# Patient Record
Sex: Male | Born: 1985 | Race: Black or African American | Hispanic: No | Marital: Single | Smoking: Current every day smoker
Health system: Southern US, Community
[De-identification: ages and names within clinical notes are randomized; demographics above are authoritative.]

## PROBLEM LIST (undated history)

## (undated) DIAGNOSIS — J45909 Unspecified asthma, uncomplicated: Secondary | ICD-10-CM

---
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
## 2007-09-26 ENCOUNTER — Emergency Department (HOSPITAL_COMMUNITY): Admission: EM | Admit: 2007-09-26 | Discharge: 2007-09-26 | Payer: Self-pay | Admitting: Emergency Medicine

## 2007-10-04 ENCOUNTER — Ambulatory Visit: Payer: Self-pay | Admitting: Psychiatry

## 2007-10-04 ENCOUNTER — Inpatient Hospital Stay (HOSPITAL_COMMUNITY): Admission: AD | Admit: 2007-10-04 | Discharge: 2007-10-08 | Payer: Self-pay | Admitting: Psychiatry

## 2007-10-08 ENCOUNTER — Emergency Department (HOSPITAL_COMMUNITY): Admission: EM | Admit: 2007-10-08 | Discharge: 2007-10-08 | Payer: Self-pay | Admitting: Emergency Medicine

## 2009-08-16 ENCOUNTER — Emergency Department (HOSPITAL_COMMUNITY): Admission: EM | Admit: 2009-08-16 | Discharge: 2009-08-17 | Payer: Self-pay | Admitting: Emergency Medicine

## 2010-01-10 ENCOUNTER — Inpatient Hospital Stay (HOSPITAL_COMMUNITY): Admission: EM | Admit: 2010-01-10 | Discharge: 2010-01-11 | Payer: Self-pay | Admitting: Emergency Medicine

## 2010-03-27 ENCOUNTER — Emergency Department (HOSPITAL_COMMUNITY): Admission: EM | Admit: 2010-03-27 | Discharge: 2010-03-27 | Payer: Self-pay | Admitting: Family Medicine
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
## 2011-01-27 LAB — POCT I-STAT, CHEM 8
Calcium, Ion: 1.05 mmol/L — ABNORMAL LOW (ref 1.12–1.32)
Chloride: 106 mEq/L (ref 96–112)
Creatinine, Ser: 1.2 mg/dL (ref 0.4–1.5)
Glucose, Bld: 102 mg/dL — ABNORMAL HIGH (ref 70–99)
HCT: 45 % (ref 39.0–52.0)

## 2011-01-27 LAB — SAMPLE TO BLOOD BANK
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
## 2011-03-21 NOTE — Op Note (Signed)
[name], TANT NO.:  [id number]   MEDICAL RECORD NO.:  [id number]                   PATIENT TYPE:  EMS   LOCATION:  ED                                   FACILITY:  [facility]   PHYSICIAN:  Lindaann Slough, M.D.               DATE OF BIRTH:  1986-04-26   DATE OF PROCEDURE:  05/10/2004  DATE OF DISCHARGE:                                 OPERATIVE REPORT   PREOPERATIVE DIAGNOSES:  Torsion of right testis.   POSTOPERATIVE DIAGNOSES:  Torsion of right testis.   PROCEDURE:  Scrotal exploration right testis, bilateral orchiopexy and  bilateral excision of appendix testis.   SURGEON:  Lindaann Slough, M.D.   ANESTHESIA:  General.   INDICATIONS FOR PROCEDURE:  The patient is a 25 year old male who woke up  this morning at about 7:30 with sudden onset of severe right testicular  pain.  He was taken to the emergency room.  Physical examination showed a  swollen and exquisitely tender right testis.  Ultrasound of the scrotum  showed no blood flow to the right testis and a small right hydrocele and  normal flow to the left testis. A diagnosis of torsion was made and the  patient is scheduled for exploration of the right testis, possible right  orchiectomy and orchiopexy.   Under general anesthesia, the patient was prepped and draped and placed in  the supine position.  A longitudinal incision was made on the right scrotum.  The incision was carried down to the tunica vaginalis which was then  incised. A small amount of pinkish fluid was drained out from the tunica  vaginalis.  The testis was found to be dark and there was 180 degree torsion  of the spermatic cord.  The torsion of the spermatic cord was done and the  testis was wrapped in warm saline.  Then an incision was made on the left  scrotum. The incision was carried down to the tunica vaginalis which was  then incised.  Incision of appendix testis was done and then an orchiopexy  was done by  approximating the medial lateral and inferior aspect of the  testis to the medial, lateral and inferior portion of the scrotum.  The  testis was then replaced in the scrotum and the scrotum was closed in two  layers with 2-0 Vicryl.  Then attention was replaced on the right testis.  The color of the right testis has changed to pink. Then a small incision was  made in the tunica albuginea and there was pink blood coming out of the  incision. The incision was then closed with 3-0 Vicryl and because we had  good flow and the color of the testis has changed too, I think it was  decided to save the testicle. Excision of the appendix testis was done and  then an orchiopexy was done using the same technique by approximating  the  medial, lateral and inferior aspect of the testis to the medial lateral and  inferior aspect of the scrotum with 2-0 silk. Hemostasis was secured with  electrocautery.  Then the incision was closed in two layers with 3-0 Vicryl.   The patient tolerated the procedure well and left the OR in satisfactory  condition to post anesthesia care unit.                                               Lindaann Slough, M.D.    MN/MEDQ  D:  05/10/2004  T:  05/10/2004  Job:  [id number]
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
## 2011-03-21 NOTE — Discharge Summary (Signed)
[name], [name] NO.:  [id number]   MEDICAL RECORD NO.:  [id number]          PATIENT TYPE:  IPS   LOCATION:  0400                          FACILITY:  BH   PHYSICIAN:  Anselm Jungling, MD  DATE OF BIRTH:  Jul 16, 1986   DATE OF ADMISSION:  10/04/2007  DATE OF DISCHARGE:  10/08/2007                               DISCHARGE SUMMARY   IDENTIFYING DATA AND REASON FOR ADMISSION:  This was an inpatient  psychiatric admission for [name], a 25 year old single African American  male admitted due to increasing suicidal ideation and possible  psychosis.  He came to [place] with a prior history of schizoaffective  disorder.  He had been treated here as an inpatient in 2006 when he was  25 years old, on our adolescent unit.  He was also involved IN cannabis  Abuse.  He was given initial Axis I diagnosis of schizoaffective  disorder by history, currently depressed, rule out psychotic features,  rule out substance-induced psychosis, and cannabis abuse.  Please refer  to the admission note for further details pertaining to the symptoms,  circumstances and history that led to his hospitalization.   MEDICAL AND LABORATORY:  The patient was in good health without any  active or chronic medical problems, but he did have a history of sickle  cell trait.  He was medically and physically assessed by the psychiatric  nurse practitioner.  He was treated with Augmentin for a lesion on his  left forearm that appeared to be some form of insect bite.  This  resolved without complications.   HOSPITAL COURSE:  The patient was admitted to the adult inpatient  psychiatric service.  He presented as a well-nourished, well-developed  male who initially only gave brief acknowledgment of our efforts to  interview him.  It was presumed that he was depressed, guarded, and  possibly psychotic.   The following day, the patient acknowledged that he had ignored me the  day prior, and explained that he was  just depressed, hearing voices, I  needed my medication.   The patient was up and attending groups, was alert, and fully oriented.  He was pleasant and cooperative.  He was treated with Seroquel 200 mg  q.h.s..  He was agreeable to a family meeting.  However, there was not  adequate time for the family meeting to occur.  The patient continued  stable, well compensated, pleasant and cooperative, and absent suicidal  ideation.  He appeared appropriate for discharge on the fifth hospital  day.  He agreed to the following aftercare plan.   AFTERCARE:  The patient was to follow-up the [facility] with an  appointment on December 18, 23 8, and also the [facility], with an  appointment on October 08, 2007, to help address substance abuse issues.   DISCHARGE MEDICATIONS:  Augmentin 875 mg b.i.d. until finished, and  Seroquel 200 mg q.h.s..   DISCHARGE DIAGNOSES:  AXIS I: Status post substance-induced psychosis,  polysubstance abuse NOS.  AXIS II: Deferred.  AXIS III: No acute or chronic illnesses, status post insect bite,  resolving.  AXIS IV: Stressors severe.  AXIS V: GAF on discharge 55.      Anselm Jungling, MD  Electronically Signed     SPB/MEDQ  D:  10/21/2007  T:  10/22/2007  Job:  [id number]
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
## 2011-07-06 ENCOUNTER — Emergency Department (HOSPITAL_COMMUNITY): Payer: Medicaid Other

## 2011-07-06 ENCOUNTER — Emergency Department (HOSPITAL_COMMUNITY)
Admission: EM | Admit: 2011-07-06 | Discharge: 2011-07-07 | Disposition: A | Discharge status: Home / Self Care | Payer: Medicaid Other | Attending: Emergency Medicine | Admitting: Emergency Medicine

## 2011-07-06 DIAGNOSIS — X500XXA Overexertion from strenuous movement or load, initial encounter: Secondary | ICD-10-CM | POA: Insufficient documentation

## 2011-07-06 DIAGNOSIS — S99919A Unspecified injury of unspecified ankle, initial encounter: Secondary | ICD-10-CM | POA: Insufficient documentation

## 2011-07-06 DIAGNOSIS — Z8659 Personal history of other mental and behavioral disorders: Secondary | ICD-10-CM | POA: Insufficient documentation

## 2011-07-06 DIAGNOSIS — F329 Major depressive disorder, single episode, unspecified: Secondary | ICD-10-CM | POA: Insufficient documentation

## 2011-07-06 DIAGNOSIS — F411 Generalized anxiety disorder: Secondary | ICD-10-CM | POA: Insufficient documentation

## 2011-07-06 DIAGNOSIS — Y92009 Unspecified place in unspecified non-institutional (private) residence as the place of occurrence of the external cause: Secondary | ICD-10-CM | POA: Insufficient documentation

## 2011-07-06 DIAGNOSIS — S8990XA Unspecified injury of unspecified lower leg, initial encounter: Secondary | ICD-10-CM | POA: Insufficient documentation

## 2011-07-06 DIAGNOSIS — F3289 Other specified depressive episodes: Secondary | ICD-10-CM | POA: Insufficient documentation

## 2011-07-06 DIAGNOSIS — M25569 Pain in unspecified knee: Secondary | ICD-10-CM | POA: Insufficient documentation

## 2011-07-06 DIAGNOSIS — S83106A Unspecified dislocation of unspecified knee, initial encounter: Secondary | ICD-10-CM | POA: Insufficient documentation

## 2011-08-11 LAB — TSH: TSH: 0.685

## 2011-08-11 LAB — URINALYSIS, ROUTINE W REFLEX MICROSCOPIC
Nitrite: NEGATIVE
Specific Gravity, Urine: 1.009
Urobilinogen, UA: 0.2
pH: 7.5

## 2011-08-11 LAB — COMPREHENSIVE METABOLIC PANEL
Albumin: 3.9
BUN: 8
Calcium: 8.9
Creatinine, Ser: 1.06
Potassium: 3.3 — ABNORMAL LOW
Total Protein: 6.4

## 2011-08-11 LAB — CBC
HCT: 40.8
MCHC: 34.4
MCV: 91.5
Platelets: 221
RDW: 13.5

## 2011-08-11 LAB — THC (MARIJUANA), URINE, CONFIRMATION: Marijuana, Ur-Confirmation: 180 ng/mL

## 2011-08-11 LAB — GC/CHLAMYDIA PROBE AMP, URINE: Chlamydia, Swab/Urine, PCR: NEGATIVE

## 2011-08-11 LAB — DRUGS OF ABUSE SCREEN W/O ALC, ROUTINE URINE
Amphetamine Screen, Ur: NEGATIVE
Creatinine,U: 72.7
Marijuana Metabolite: POSITIVE — AB
Opiate Screen, Urine: NEGATIVE
Propoxyphene: NEGATIVE

## 2013-05-11 ENCOUNTER — Emergency Department (HOSPITAL_COMMUNITY)
Admission: EM | Admit: 2013-05-11 | Discharge: 2013-05-11 | Disposition: A | Discharge status: Home / Self Care | Payer: Medicaid Other | Attending: Emergency Medicine | Admitting: Emergency Medicine

## 2013-05-11 ENCOUNTER — Encounter (HOSPITAL_COMMUNITY): Payer: Self-pay | Admitting: Emergency Medicine

## 2013-05-11 DIAGNOSIS — K089 Disorder of teeth and supporting structures, unspecified: Secondary | ICD-10-CM | POA: Insufficient documentation

## 2013-05-11 DIAGNOSIS — K029 Dental caries, unspecified: Secondary | ICD-10-CM | POA: Insufficient documentation

## 2013-05-11 DIAGNOSIS — J45909 Unspecified asthma, uncomplicated: Secondary | ICD-10-CM | POA: Insufficient documentation

## 2013-05-11 DIAGNOSIS — K0889 Other specified disorders of teeth and supporting structures: Secondary | ICD-10-CM

## 2013-05-11 DIAGNOSIS — H5710 Ocular pain, unspecified eye: Secondary | ICD-10-CM | POA: Insufficient documentation

## 2013-05-11 DIAGNOSIS — H9209 Otalgia, unspecified ear: Secondary | ICD-10-CM | POA: Insufficient documentation

## 2013-05-11 HISTORY — DX: Unspecified asthma, uncomplicated: J45.909

## 2013-05-11 MED ORDER — BUPIVACAINE HCL 0.25 % IJ SOLN: 5.0000 mL | Freq: Once | INTRAMUSCULAR | Status: DC

## 2013-05-11 MED ORDER — OXYCODONE-ACETAMINOPHEN 10-325 MG PO TABS: 1.0000 | ORAL_TABLET | ORAL | Status: DC | PRN

## 2013-05-11 MED ORDER — BUPIVACAINE HCL (PF) 0.5 % IJ SOLN: 5.0000 mL | Freq: Once | INTRAMUSCULAR | Status: DC

## 2013-05-11 MED ORDER — BUPIVACAINE HCL (PF) 0.25 % IJ SOLN: 5.0000 mL | Freq: Once | INTRAMUSCULAR | Status: DC

## 2013-05-11 NOTE — ED Notes (Signed)
PT. REPORTS RIGHT LOWER MOLAR PAIN FOR 2 WEEKS .

## 2013-05-11 NOTE — ED Provider Notes (Signed)
History    CSN: [id number] Arrival date & time 05/11/13  2144  None    Chief Complaint  Patient presents with  . Dental Pain   (Consider location/radiation/quality/duration/timing/severity/associated sxs/prior Treatment) HPI Comments: Patient admits to dental follow up tomorrow at 3pm. States dentist gave him tramadol for pain control without relief. Has also taken course of Amoxicillin prescribed by his dentist to cover for infection without relief.  Patient is a 27 y.o. male presenting with tooth pain. The history is provided by the patient. No language interpreter was used.  Dental Pain Location:  Lower Lower teeth location: R lower canine to 2nd molar. Quality:  Sharp, throbbing and aching Severity:  Moderate Onset quality:  Gradual Duration:  2 weeks Timing:  Constant Progression:  Worsening Chronicity:  New Context: recent dental surgery (symptoms began after multiple dental fillings)   Previous work-up:  Filled cavity Relieved by:  Nothing Ineffective treatments:  Acetaminophen and NSAIDs Associated symptoms: no difficulty swallowing, no drooling, no fever, no gum swelling, no neck swelling, no oral bleeding, no oral lesions and no trismus   Associated symptoms comment:  + R ear and R eye pain (intermittent)  Past Medical History  Diagnosis Date  . Asthma    History reviewed. No pertinent past surgical history. No family history on file. History  Substance Use Topics  . Smoking status: Not on file  . Smokeless tobacco: Not on file  . Alcohol Use: Not on file    Review of Systems  Constitutional: Negative for fever.  HENT: Negative for drooling and mouth sores.     Allergies  Review of patient's allergies indicates no known allergies.  Home Medications   Current Outpatient Rx  Name  Route  Sig  Dispense  Refill  . amoxicillin (AMOXIL) 500 MG capsule   Oral   Take 500 mg by mouth 2 (two) times daily.         Marland Kitchen oxyCODONE-acetaminophen (PERCOCET)  10-325 MG per tablet   Oral   Take 1 tablet by mouth every 4 (four) hours as needed for pain.          BP 133/77  Pulse 57  Temp(Src) 98.5 F (36.9 C) (Oral)  Resp 14  SpO2 98% Physical Exam  Nursing note and vitals reviewed. Constitutional: He is oriented to person, place, and time. He appears well-developed and well-nourished. No distress.  HENT:  Head: Normocephalic and atraumatic. No trismus in the jaw.  Right Ear: Tympanic membrane, external ear and ear canal normal. No mastoid tenderness.  Left Ear: Tympanic membrane, external ear and ear canal normal. No mastoid tenderness.  Nose: Nose normal.  Mouth/Throat: Uvula is midline and mucous membranes are normal. No oral lesions. Normal dentition. Dental caries (on left) present. No dental abscesses, edematous or lacerations. No oropharyngeal exudate, posterior oropharyngeal edema, posterior oropharyngeal erythema or tonsillar abscesses.  Eyes: Conjunctivae and EOM are normal. Pupils are equal, round, and reactive to light. No scleral icterus.  Neck: Normal range of motion. Neck supple.  Cardiovascular: Normal rate, regular rhythm and intact distal pulses.   Pulmonary/Chest: Effort normal. No stridor. No respiratory distress.  Musculoskeletal: Normal range of motion.  Lymphadenopathy:    He has no cervical adenopathy.  Neurological: He is alert and oriented to person, place, and time.  Skin: Skin is warm and dry. No rash noted. He is not diaphoretic. No erythema. No pallor.  Psychiatric: He has a normal mood and affect.   ED Course  Dental Date/Time: 05/12/2013  7:49 AM Performed by: Antony Madura Authorized by: Antony Madura Consent: Verbal consent obtained. written consent not obtained. Risks and benefits: risks, benefits and alternatives were discussed Consent given by: patient Patient understanding: patient states understanding of the procedure being performed Patient consent: the patient's understanding of the procedure  matches consent given Procedure consent: procedure consent matches procedure scheduled Relevant documents: relevant documents present and verified Test results: test results available and properly labeled Site marked: the operative site was marked Imaging studies: imaging studies available Required items: required blood products, implants, devices, and special equipment available Patient identity confirmed: verbally with patient Time out: Immediately prior to procedure a "time out" was called to verify the correct patient, procedure, equipment, support staff and site/side marked as required. Preparation: Patient was prepped and draped in the usual sterile fashion. Local anesthesia used: yes Anesthesia: nerve block Local anesthetic: bupivacaine 0.5% without epinephrine Anesthetic total: 3 ml Patient sedated: no Patient tolerance: Patient tolerated the procedure well with no immediate complications. Comments: Inferior alveolar nerve block on R side with 3ml of bupivacaine   (including critical care time) Labs Reviewed - No data to display No results found.  1. Pain, dental    MDM  Uncomplicated dental pain - Patient afebrile and hemodynamically stable. Uvula midline without evidence of peritonsillar abscess. No trismus or stridor. No concerning signs for Ludwig's Angina. Inferior alveolar nerve block attempted with mild-moderate relief of symptoms. Patient appropriate for d/c with dental follow up at appointment tomorrow. Rx for Percocet given to tide patient over to this appointment. Indications for ED return discussed with the patient who verbalizes comfort and understanding with this d/c plan.  Antony Madura, PA-C 05/12/13 [phone]

## 2013-05-12 NOTE — ED Provider Notes (Signed)
Medical screening examination/treatment/procedure(s) were performed by non-physician practitioner and as supervising physician I was immediately available for consultation/collaboration.  Olivia Mackie, MD 05/12/13 2156

## 2014-01-24 ENCOUNTER — Encounter (HOSPITAL_COMMUNITY): Payer: Self-pay | Admitting: Emergency Medicine

## 2014-01-24 ENCOUNTER — Emergency Department (INDEPENDENT_AMBULATORY_CARE_PROVIDER_SITE_OTHER)
Admission: EM | Admit: 2014-01-24 | Discharge: 2014-01-24 | Disposition: A | Discharge status: Home / Self Care | Payer: Medicaid Other | Source: Home / Self Care | Attending: Emergency Medicine | Admitting: Emergency Medicine

## 2014-01-24 DIAGNOSIS — M779 Enthesopathy, unspecified: Secondary | ICD-10-CM

## 2014-01-24 MED ORDER — MELOXICAM 15 MG PO TABS: 15.0000 mg | ORAL_TABLET | Freq: Every day | ORAL | Status: DC

## 2014-01-24 MED ORDER — TRAMADOL HCL 50 MG PO TABS: 100.0000 mg | ORAL_TABLET | Freq: Three times a day (TID) | ORAL | Status: DC | PRN

## 2014-01-24 NOTE — ED Notes (Signed)
States hurt right arm Saturday while lifting at work; states started hurting later that day.

## 2014-01-24 NOTE — Discharge Instructions (Signed)
Tendinitis °Tendinitis is swelling and inflammation of the tendons. Tendons are band-like tissues that connect muscle to bone. Tendinitis commonly occurs in the:  °· Shoulders (rotator cuff). °· Heels (Achilles tendon). °· Elbows (triceps tendon). °CAUSES °Tendinitis is usually caused by overusing the tendon, muscles, and joints involved. When the tissue surrounding a tendon (synovium) becomes inflamed, it is called tenosynovitis. Tendinitis commonly develops in people whose jobs require repetitive motions. °SYMPTOMS °· Pain. °· Tenderness. °· Mild swelling. °DIAGNOSIS °Tendinitis is usually diagnosed by physical exam. Your caregiver may also order X-rays or other imaging tests. °TREATMENT °Your caregiver may recommend certain medicines or exercises for your treatment. °HOME CARE INSTRUCTIONS  °· Use a sling or splint for as long as directed by your caregiver until the pain decreases. °· Put ice on the injured area. °· Put ice in a plastic bag. °· Place a towel between your skin and the bag. °· Leave the ice on for 15-20 minutes, 03-04 times a day. °· Avoid using the limb while the tendon is painful. Perform gentle range of motion exercises only as directed by your caregiver. Stop exercises if pain or discomfort increase, unless directed otherwise by your caregiver. °· Only take over-the-counter or prescription medicines for pain, discomfort, or fever as directed by your caregiver. °SEEK MEDICAL CARE IF:  °· Your pain and swelling increase. °· You develop new, unexplained symptoms, especially increased numbness in the hands. °MAKE SURE YOU:  °· Understand these instructions. °· Will watch your condition. °· Will get help right away if you are not doing well or get worse. °Document Released: 10/17/2000 Document Revised: 01/12/2012 Document Reviewed: 01/06/2011 °ExitCare® Patient Information ©2014 [employer]. ° °

## 2014-01-24 NOTE — ED Provider Notes (Signed)
Chief Complaint    Chief Complaint  Patient presents with  . Arm Pain    History of Present Illness     [name] is a 28 year old male who has had a four-day history of right wrist and forearm pain after doing a lot of heavy lifting at work. The patient states that objects he lives range from 5 pounds to 100 pounds. He denies any specific injury, but after work his arm felt sore. This extends from the wrist up to the elbow. It hurts to flex his wrist. There is some swelling. No numbness or tingling of the hand. The pain is confined to the dorsum of the forearm. There is no muscle weakness.  Review of Systems     Other than as noted above, the patient denies any of the following symptoms: Systemic:  No fevers, chills, sweats, or muscle aches.  No weight loss.  Musculoskeletal:  No joint pain, arthritis, bursitis, swelling, back pain, or neck pain. Neurological:  No muscular weakness, paresthesias, headache, or trouble with speech or coordination.  No dizziness.  PMFSH    Past medical history, family history, social history, meds, and allergies were reviewed.  He has asthma and uses albuterol inhaler.  Physical Exam    Vital signs:  BP 125/83  Pulse 70  Temp(Src) 98.3 F (36.8 C) (Oral)  Resp 16  SpO2 100% Gen:  Alert and oriented times 3.  In no distress. Musculoskeletal: There is pain to palpation over the dorsum of the forearm extending from the wrist up to just proximal to the elbow but not in the elbow itself. The elbow joint was nontender, nonswollen, and have full range of motion with no pain. He has pain with flexion and extension of the wrist, particularly with forceful dorsiflexion. There is no swelling of the forearm, no pain over the carpal tunnel.  Otherwise, all joints had a full a ROM with no swelling, bruising or deformity.  No edema, pulses full. Extremities were warm and pink.  Capillary refill was brisk.  Skin:  Clear, warm and dry.  No rash. Neuro:  Alert and  oriented times 3.  Muscle strength was normal.  Sensation was intact to light touch.   [facility] in [facility]   He was given a thumb spica splint, and is to wear this continuously for the next 2 weeks.  Assessment    The encounter diagnosis was Tendonitis.  Due to overuse at work. Patient states this is not a workers comp case.  Plan   1.  Meds:  The following meds were prescribed:   Discharge Medication List as of 01/24/2014 10:40 AM    START taking these medications   Details  meloxicam (MOBIC) 15 MG tablet Take 1 tablet (15 mg total) by mouth daily., Starting 01/24/2014, Until Discontinued, Normal    traMADol (ULTRAM) 50 MG tablet Take 2 tablets (100 mg total) by mouth every 8 (eight) hours as needed., Starting 01/24/2014, Until Discontinued, Normal        2.  Patient Education/Counseling:  The patient was given appropriate handouts, self care instructions, and instructed in symptomatic relief, including rest and activity, elevation, application of ice and compression.    3.  Follow up:  The patient was told to follow up here if no better in 3 to 4 days, or sooner if becoming worse in any way, and given some red flag symptoms such as worsening pain or new neurological symptoms which would prompt immediate return.  Follow up  here as needed.     Reuben Likes, MD 01/24/14 [phone]

## 2014-03-01 ENCOUNTER — Emergency Department (INDEPENDENT_AMBULATORY_CARE_PROVIDER_SITE_OTHER)
Admission: EM | Admit: 2014-03-01 | Discharge: 2014-03-01 | Disposition: A | Discharge status: Home / Self Care | Payer: Medicaid Other | Source: Home / Self Care | Attending: Family Medicine | Admitting: Family Medicine

## 2014-03-01 ENCOUNTER — Encounter (HOSPITAL_COMMUNITY): Payer: Self-pay | Admitting: Emergency Medicine

## 2014-03-01 DIAGNOSIS — S61209A Unspecified open wound of unspecified finger without damage to nail, initial encounter: Secondary | ICD-10-CM

## 2014-03-01 DIAGNOSIS — S61211A Laceration without foreign body of left index finger without damage to nail, initial encounter: Secondary | ICD-10-CM

## 2014-03-01 DIAGNOSIS — W278XXA Contact with other nonpowered hand tool, initial encounter: Secondary | ICD-10-CM

## 2014-03-01 NOTE — ED Provider Notes (Signed)
Medical screening examination/treatment/procedure(s) were performed by a resident physician or non-physician practitioner and as the supervising physician I was immediately available for consultation/collaboration.  Clementeen GrahamEvan Charo Philipp, MD    Rodolph BongEvan S Alyssabeth Bruster, MD 03/01/14 [phone]

## 2014-03-01 NOTE — ED Provider Notes (Signed)
CSN: [id number]     Arrival date & time 03/01/14  1938 History   First MD Initiated Contact with Patient 03/01/14 2050     Chief Complaint  Patient presents with  . Laceration   (Consider location/radiation/quality/duration/timing/severity/associated sxs/prior Treatment) Patient is a 28 y.o. male presenting with skin laceration. The history is provided by the patient.  Laceration Location:  Hand Hand laceration location:  L finger (Left index finger) Length (cm):  1.5 Depth:  Through dermis Quality: straight   Bleeding: controlled   Time since incident:  3 hours Injury mechanism: Was opening box with box cutter at work today and accidentally cut himself. Foreign body present:  No foreign bodies Tetanus status:  Up to date (within last 3 years)   Past Medical History  Diagnosis Date  . Asthma    History reviewed. No pertinent past surgical history. History reviewed. No pertinent family history. History  Substance Use Topics  . Smoking status: Heavy Tobacco Smoker -- 1.00 packs/day  . Smokeless tobacco: Not on file  . Alcohol Use: Yes     Comment: occ    Review of Systems  All other systems reviewed and are negative.   Allergies  Review of patient's allergies indicates no known allergies.  Home Medications   Prior to Admission medications   Medication Sig Start Date End Date Taking? Authorizing Provider  amoxicillin (AMOXIL) 500 MG capsule Take 500 mg by mouth 2 (two) times daily.    Historical Provider, MD  meloxicam (MOBIC) 15 MG tablet Take 1 tablet (15 mg total) by mouth daily. 01/24/14   Reuben Likesavid C Keller, MD  oxyCODONE-acetaminophen (PERCOCET) 10-325 MG per tablet Take 1 tablet by mouth every 4 (four) hours as needed for pain. 05/11/13   Antony MaduraKelly Humes, PA-C  traMADol (ULTRAM) 50 MG tablet Take 2 tablets (100 mg total) by mouth every 8 (eight) hours as needed. 01/24/14   Reuben Likesavid C Keller, MD   BP 122/68  Pulse 49  Temp(Src) 98.5 F (36.9 C) (Oral)  Resp 15  SpO2  100% Physical Exam  Nursing note and vitals reviewed. Constitutional: He is oriented to person, place, and time. He appears well-developed and well-nourished. No distress.  HENT:  Head: Normocephalic and atraumatic.  Eyes: Conjunctivae are normal.  Cardiovascular: Normal rate.   Pulmonary/Chest: Effort normal.  Musculoskeletal: Normal range of motion.  Neurological: He is alert and oriented to person, place, and time.  Skin: Skin is warm and dry.  1.5 cm laceration at lateral left index finger. CSM exam of finger intact  Psychiatric: He has a normal mood and affect. His behavior is normal.    ED Course  LACERATION REPAIR Date/Time: 03/01/2014 9:21 PM Performed by: Lemmie EvensPRESSON, Alvino Lechuga LEE Authorized by: Clementeen GrahamOREY, EVAN, S Consent: Verbal consent obtained. Risks and benefits: risks, benefits and alternatives were discussed Consent given by: patient Patient understanding: patient states understanding of the procedure being performed Patient identity confirmed: verbally with patient and arm band Time out: Immediately prior to procedure a "time out" was called to verify the correct patient, procedure, equipment, support staff and site/side marked as required. Body area: upper extremity Location details: left index finger Laceration length: 1.5 cm Foreign bodies: no foreign bodies Tendon involvement: none Nerve involvement: none Vascular damage: no Local anesthetic: lidocaine 2% without epinephrine Anesthetic total: 2 ml Patient sedated: no Preparation: Patient was prepped and draped in the usual sterile fashion. Irrigation solution: saline Irrigation method: syringe Amount of cleaning: standard Debridement: none Degree of undermining: none Skin closure:  4-0 Prolene Number of sutures: 5 Technique: simple Approximation: close Approximation difficulty: simple Dressing: gauze roll Patient tolerance: Patient tolerated the procedure well with no immediate complications.   (including  critical care time) Labs Review Labs Reviewed - No data to display  Imaging Review No results found.   MDM   1. Laceration of index finger of left hand without complication   Wound care with suture removal in 7-9 days.     Jess BartersJennifer Lee Big CabinPresson, GeorgiaPA 03/01/14 2122

## 2014-03-01 NOTE — ED Notes (Signed)
Discussed wound care:keep clean covered, dry, return to care for any pus or red streaks, suture removal in ~ 10 days

## 2014-03-01 NOTE — Discharge Instructions (Signed)

## 2014-03-01 NOTE — ED Notes (Signed)
Patient states at 4 pm today, he was opening a box when box cutter slipped, and he cut his left index finger . Last tetanus was reportedly ~3 years ago. No bleeding at present

## 2014-06-17 ENCOUNTER — Encounter (HOSPITAL_COMMUNITY): Payer: Self-pay | Admitting: Emergency Medicine

## 2014-06-17 DIAGNOSIS — W3400XA Accidental discharge from unspecified firearms or gun, initial encounter: Secondary | ICD-10-CM | POA: Insufficient documentation

## 2014-06-17 DIAGNOSIS — S51009A Unspecified open wound of unspecified elbow, initial encounter: Secondary | ICD-10-CM | POA: Insufficient documentation

## 2014-06-17 DIAGNOSIS — J45909 Unspecified asthma, uncomplicated: Secondary | ICD-10-CM | POA: Insufficient documentation

## 2014-06-17 DIAGNOSIS — F17200 Nicotine dependence, unspecified, uncomplicated: Secondary | ICD-10-CM | POA: Insufficient documentation

## 2014-06-17 DIAGNOSIS — Z792 Long term (current) use of antibiotics: Secondary | ICD-10-CM | POA: Insufficient documentation

## 2014-06-17 DIAGNOSIS — S4490XA Injury of unspecified nerve at shoulder and upper arm level, unspecified arm, initial encounter: Secondary | ICD-10-CM | POA: Insufficient documentation

## 2014-06-17 DIAGNOSIS — Y9389 Activity, other specified: Secondary | ICD-10-CM | POA: Insufficient documentation

## 2014-06-17 DIAGNOSIS — Z791 Long term (current) use of non-steroidal anti-inflammatories (NSAID): Secondary | ICD-10-CM | POA: Insufficient documentation

## 2014-06-17 DIAGNOSIS — Y929 Unspecified place or not applicable: Secondary | ICD-10-CM | POA: Insufficient documentation

## 2014-06-22 ENCOUNTER — Telehealth (HOSPITAL_COMMUNITY): Payer: Self-pay | Admitting: Emergency Medicine
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
## 2015-10-01 ENCOUNTER — Emergency Department (HOSPITAL_COMMUNITY)
Admission: EM | Admit: 2015-10-01 | Discharge: 2015-10-01 | Disposition: A | Discharge status: Home / Self Care | Payer: Medicaid Other | Attending: Emergency Medicine | Admitting: Emergency Medicine

## 2015-10-01 ENCOUNTER — Encounter (HOSPITAL_COMMUNITY): Payer: Self-pay | Admitting: *Deleted

## 2015-10-01 DIAGNOSIS — F17200 Nicotine dependence, unspecified, uncomplicated: Secondary | ICD-10-CM | POA: Diagnosis not present

## 2015-10-01 DIAGNOSIS — X58XXXA Exposure to other specified factors, initial encounter: Secondary | ICD-10-CM | POA: Diagnosis not present

## 2015-10-01 DIAGNOSIS — Y939 Activity, unspecified: Secondary | ICD-10-CM | POA: Insufficient documentation

## 2015-10-01 DIAGNOSIS — J45909 Unspecified asthma, uncomplicated: Secondary | ICD-10-CM | POA: Insufficient documentation

## 2015-10-01 DIAGNOSIS — R1013 Epigastric pain: Secondary | ICD-10-CM | POA: Diagnosis not present

## 2015-10-01 DIAGNOSIS — Y999 Unspecified external cause status: Secondary | ICD-10-CM | POA: Diagnosis not present

## 2015-10-01 DIAGNOSIS — Y929 Unspecified place or not applicable: Secondary | ICD-10-CM | POA: Diagnosis not present

## 2015-10-01 DIAGNOSIS — S29019A Strain of muscle and tendon of unspecified wall of thorax, initial encounter: Secondary | ICD-10-CM

## 2015-10-01 DIAGNOSIS — S29012A Strain of muscle and tendon of back wall of thorax, initial encounter: Secondary | ICD-10-CM | POA: Diagnosis not present

## 2015-10-01 LAB — COMPREHENSIVE METABOLIC PANEL
ALBUMIN: 3.9 g/dL (ref 3.5–5.0)
ALT: 24 U/L (ref 17–63)
AST: 23 U/L (ref 15–41)
Alkaline Phosphatase: 50 U/L (ref 38–126)
Anion gap: 8 (ref 5–15)
BUN: 13 mg/dL (ref 6–20)
CHLORIDE: 104 mmol/L (ref 101–111)
CO2: 26 mmol/L (ref 22–32)
Calcium: 9 mg/dL (ref 8.9–10.3)
Creatinine, Ser: 1.1 mg/dL (ref 0.61–1.24)
GFR calc Af Amer: >60 mL/min (ref >60)
Glucose, Bld: 109 mg/dL — ABNORMAL HIGH (ref 65–99)
POTASSIUM: 3.5 mmol/L (ref 3.5–5.1)
SODIUM: 138 mmol/L (ref 135–145)
Total Bilirubin: 0.5 mg/dL (ref 0.3–1.2)
Total Protein: 5.7 g/dL — ABNORMAL LOW (ref 6.5–8.1)

## 2015-10-01 LAB — CBC WITH DIFFERENTIAL/PLATELET
BASOS ABS: 0 10*3/uL (ref 0.0–0.1)
Basophils Relative: 0 %
EOS ABS: 0.2 10*3/uL (ref 0.0–0.7)
Eosinophils Relative: 2 %
HEMATOCRIT: 39.4 % (ref 39.0–52.0)
Hemoglobin: 13 g/dL (ref 13.0–17.0)
LYMPHS ABS: 3 10*3/uL (ref 0.7–4.0)
Lymphocytes Relative: 33 %
MCH: 31.3 pg (ref 26.0–34.0)
MCHC: 33 g/dL (ref 30.0–36.0)
MCV: 94.7 fL (ref 78.0–100.0)
MONO ABS: 0.5 10*3/uL (ref 0.1–1.0)
Monocytes Relative: 5 %
NEUTROS ABS: 5.4 10*3/uL (ref 1.7–7.7)
Neutrophils Relative %: 60 %
PLATELETS: 238 10*3/uL (ref 150–400)
RBC: 4.16 MIL/uL — ABNORMAL LOW (ref 4.22–5.81)
RDW: 13 % (ref 11.5–15.5)
WBC: 9.1 10*3/uL (ref 4.0–10.5)

## 2015-10-01 LAB — URINE MICROSCOPIC-ADD ON

## 2015-10-01 LAB — LIPASE, BLOOD: LIPASE: 20 U/L (ref 11–51)

## 2015-10-01 LAB — URINALYSIS, ROUTINE W REFLEX MICROSCOPIC
Bilirubin Urine: NEGATIVE
GLUCOSE, UA: NEGATIVE mg/dL
Hgb urine dipstick: NEGATIVE
Ketones, ur: NEGATIVE mg/dL
LEUKOCYTES UA: NEGATIVE
NITRITE: NEGATIVE
PROTEIN: NEGATIVE mg/dL
Specific Gravity, Urine: 1.026 (ref 1.005–1.030)
pH: 6.5 (ref 5.0–8.0)

## 2015-10-01 MED ORDER — METHOCARBAMOL 500 MG PO TABS
1000.0000 mg | ORAL_TABLET | Freq: Once | ORAL | Status: AC
  Administered 2015-10-01: 1000 mg via ORAL
  Filled 2015-10-01: qty 2

## 2015-10-01 MED ORDER — METHOCARBAMOL 500 MG PO TABS: 1000.0000 mg | ORAL_TABLET | Freq: Three times a day (TID) | ORAL | Status: DC | PRN

## 2015-10-01 MED ORDER — ONDANSETRON 4 MG PO TBDP: ORAL_TABLET | ORAL | Status: DC

## 2015-10-01 MED ORDER — PANTOPRAZOLE SODIUM 20 MG PO TBEC: 20.0000 mg | DELAYED_RELEASE_TABLET | Freq: Every day | ORAL | Status: DC

## 2015-10-01 MED ORDER — GI COCKTAIL ~~LOC~~
30.0000 mL | Freq: Once | ORAL | Status: AC
  Administered 2015-10-01: 30 mL via ORAL
  Filled 2015-10-01: qty 30

## 2015-10-01 NOTE — ED Notes (Signed)
The pt is c/o a soreness in his epigastric area small area  For 2 weeks

## 2015-10-01 NOTE — ED Provider Notes (Signed)
CSN: [id number]     Arrival date & time 10/01/15  0129 History   By signing my name below, I, Arlan OrganAshley Leger, attest that this documentation has been prepared under the direction and in the presence of Loren Raceravid Izzabell Klasen, MD.  Electronically Signed: Arlan OrganAshley Leger, ED Scribe. 10/01/2015. 2:26 AM.    Chief Complaint  Patient presents with  . soreness epogastric    The history is provided by the patient. No language interpreter was used.    HPI Comments: [name] is a 29 y.o. male without any pertinent past medical history who presents to the Emergency Department complaining of constant, ongoing back pain x 2 weeks. Ongoing intermittent epigastric abdominal pain described as soreness also reported. No aggravating or alleviating factors attempted prior to arrival. No worsening pain after meals. OTC Ibuprofen attempted prior to arrival without any improvement. No recent fever, chills, nausea, vomiting, chest pain, or shortness of breath. Regular bowel movements reported without gross blood or melena. No previous history of same.  PCP: No PCP Per Patient    Past Medical History  Diagnosis Date  . Asthma    History reviewed. No pertinent past surgical history. No family history on file. Social History  Substance Use Topics  . Smoking status: Heavy Tobacco Smoker -- 1.00 packs/day  . Smokeless tobacco: None  . Alcohol Use: Yes     Comment: occ    Review of Systems  Constitutional: Negative for fever and chills.  Respiratory: Negative for cough and shortness of breath.   Cardiovascular: Negative for chest pain.  Gastrointestinal: Positive for abdominal pain. Negative for nausea, vomiting, diarrhea and blood in stool.  Genitourinary: Negative for dysuria, frequency and flank pain.  Musculoskeletal: Positive for myalgias and back pain. Negative for neck pain and neck stiffness.  Skin: Negative for rash and wound.  Neurological: Negative for dizziness, weakness, light-headedness, numbness  and headaches.  Psychiatric/Behavioral: Negative for confusion.  All other systems reviewed and are negative.     Allergies  Review of patient's allergies indicates no known allergies.  Home Medications   Prior to Admission medications   Medication Sig Start Date End Date Taking? Authorizing Provider  methocarbamol (ROBAXIN) 500 MG tablet Take 2 tablets (1,000 mg total) by mouth every 8 (eight) hours as needed. 10/01/15   Loren Raceravid Augustino Savastano, MD  ondansetron (ZOFRAN ODT) 4 MG disintegrating tablet 4mg  ODT q4 hours prn nausea/vomit 10/01/15   Loren Raceravid Velvie Thomaston, MD  pantoprazole (PROTONIX) 20 MG tablet Take 1 tablet (20 mg total) by mouth daily. 10/01/15   Loren Raceravid Arliene Rosenow, MD   Triage Vitals: BP 118/57 mmHg  Pulse 69  Temp(Src) 98.4 F (36.9 C) (Oral)  Resp 12  Ht 6' (1.829 m)  Wt 202 lb 9.6 oz (91.899 kg)  BMI 27.47 kg/m2  SpO2 98%   Physical Exam  Constitutional: He is oriented to person, place, and time. He appears well-developed and well-nourished. No distress.  HENT:  Head: Normocephalic and atraumatic.  Mouth/Throat: Oropharynx is clear and moist.  Eyes: EOM are normal. Pupils are equal, round, and reactive to light.  Neck: Normal range of motion. Neck supple.  Cardiovascular: Normal rate and regular rhythm.   Pulmonary/Chest: Effort normal and breath sounds normal. No respiratory distress. He has no wheezes. He has no rales. He exhibits no tenderness.  Abdominal: Soft. Bowel sounds are normal. He exhibits no distension and no mass. There is tenderness (patient has mild epigastric tenderness with palpation.). There is no rebound and no guarding.  Musculoskeletal: Normal range of  motion. He exhibits no edema or tenderness.  No CVA tenderness bilaterally. Patient does have right-sided thoracic muscular tenderness to palpation. No midline thoracic or lumbar tenderness.  Neurological: He is alert and oriented to person, place, and time.  5/5 motor in all extremities. Sensation is  fully intact.  Skin: Skin is warm and dry. No rash noted. No erythema.  Psychiatric: He has a normal mood and affect. His behavior is normal.  Nursing note and vitals reviewed.   ED Course  Procedures (including critical care time)  DIAGNOSTIC STUDIES: Oxygen Saturation is 100% on RA, Normal by my interpretation.    COORDINATION OF CARE: 2:19 AM- Will give GI cocktail and Robaxin. Will order CBC, CMP, Lipase, and urinalysis. Discussed treatment plan with pt at bedside and pt agreed to plan.  a   Labs Review Labs Reviewed  CBC WITH DIFFERENTIAL/PLATELET - Abnormal; Notable for the following:    RBC 4.16 (*)    All other components within normal limits  COMPREHENSIVE METABOLIC PANEL - Abnormal; Notable for the following:    Glucose, Bld 109 (*)    Total Protein 5.7 (*)    All other components within normal limits  URINALYSIS, ROUTINE W REFLEX MICROSCOPIC (NOT AT [facility]) - Abnormal; Notable for the following:    APPearance TURBID (*)    All other components within normal limits  URINE MICROSCOPIC-ADD ON - Abnormal; Notable for the following:    Squamous Epithelial / LPF 0-5 (*)    Bacteria, UA RARE (*)    Crystals CA OXALATE CRYSTALS (*)    All other components within normal limits  LIPASE, BLOOD    Imaging Review No results found. I have personally reviewed and evaluated these images and lab results as part of my medical decision-making.   EKG Interpretation None      MDM   Final diagnoses:  Epigastric pain  Thoracic myofascial strain, initial encounter    I personally performed the services described in this documentation, which was scribed in my presence. The recorded information has been reviewed and is accurate.   Patient's symptoms improved with GI cocktail and muscle relaxants. Laboratory exam is normal. Suspect possible gastritis and muscle strain. Advised patient to avoid NSAIDs. Return precautions given.   Loren Racer, MD 10/02/15 1343

## 2015-10-01 NOTE — Discharge Instructions (Signed)
Muscle Strain A muscle strain is an injury that occurs when a muscle is stretched beyond its normal length. Usually a small number of muscle fibers are torn when this happens. Muscle strain is rated in degrees. First-degree strains have the least amount of muscle fiber tearing and pain. Second-degree and third-degree strains have increasingly more tearing and pain.  Usually, recovery from muscle strain takes 1-2 weeks. Complete healing takes 5-6 weeks.  CAUSES  Muscle strain happens when a sudden, violent force placed on a muscle stretches it too far. This may occur with lifting, sports, or a fall.  RISK FACTORS Muscle strain is especially common in athletes.  SIGNS AND SYMPTOMS At the site of the muscle strain, there may be:  Pain.  Bruising.  Swelling.  Difficulty using the muscle due to pain or lack of normal function. DIAGNOSIS  Your health care provider will perform a physical exam and ask about your medical history. TREATMENT  Often, the best treatment for a muscle strain is resting, icing, and applying cold compresses to the injured area.  HOME CARE INSTRUCTIONS   Use the PRICE method of treatment to promote muscle healing during the first 2-3 days after your injury. The PRICE method involves:  Protecting the muscle from being injured again.  Restricting your activity and resting the injured body part.  Icing your injury. To do this, put ice in a plastic bag. Place a towel between your skin and the bag. Then, apply the ice and leave it on from 15-20 minutes each hour. After the third day, switch to moist heat packs.  Apply compression to the injured area with a splint or elastic bandage. Be careful not to wrap it too tightly. This may interfere with blood circulation or increase swelling.  Elevate the injured body part above the level of your heart as often as you can.  Only take over-the-counter or prescription medicines for pain, discomfort, or fever as directed by your  health care provider.  Warming up prior to exercise helps to prevent future muscle strains. SEEK MEDICAL CARE IF:   You have increasing pain or swelling in the injured area.  You have numbness, tingling, or a significant loss of strength in the injured area. MAKE SURE YOU:   Understand these instructions.  Will watch your condition.  Will get help right away if you are not doing well or get worse.   This information is not intended to replace advice given to you by your health care provider. Make sure you discuss any questions you have with your health care provider.   Document Released: 10/20/2005 Document Revised: 08/10/2013 Document Reviewed: 05/19/2013 Elsevier Interactive Patient Education 2016 [employer] Gastritis, Adult Gastritis is soreness and swelling (inflammation) of the lining of the stomach. Gastritis can develop as a sudden onset (acute) or long-term (chronic) condition. If gastritis is not treated, it can lead to stomach bleeding and ulcers. CAUSES  Gastritis occurs when the stomach lining is weak or damaged. Digestive juices from the stomach then inflame the weakened stomach lining. The stomach lining may be weak or damaged due to viral or bacterial infections. One common bacterial infection is the Helicobacter pylori infection. Gastritis can also result from excessive alcohol consumption, taking certain medicines, or having too much acid in the stomach.  SYMPTOMS  In some cases, there are no symptoms. When symptoms are present, they may include:  Pain or a burning sensation in the upper abdomen.  Nausea.  Vomiting.  An uncomfortable feeling of fullness  after eating. DIAGNOSIS  Your caregiver may suspect you have gastritis based on your symptoms and a physical exam. To determine the cause of your gastritis, your caregiver may perform the following:  Blood or stool tests to check for the H pylori bacterium.  Gastroscopy. A thin, flexible tube (endoscope) is  passed down the esophagus and into the stomach. The endoscope has a light and camera on the end. Your caregiver uses the endoscope to view the inside of the stomach.  Taking a tissue sample (biopsy) from the stomach to examine under a microscope. TREATMENT  Depending on the cause of your gastritis, medicines may be prescribed. If you have a bacterial infection, such as an H pylori infection, antibiotics may be given. If your gastritis is caused by too much acid in the stomach, H2 blockers or antacids may be given. Your caregiver may recommend that you stop taking aspirin, ibuprofen, or other nonsteroidal anti-inflammatory drugs (NSAIDs). HOME CARE INSTRUCTIONS  Only take over-the-counter or prescription medicines as directed by your caregiver.  If you were given antibiotic medicines, take them as directed. Finish them even if you start to feel better.  Drink enough fluids to keep your urine clear or pale yellow.  Avoid foods and drinks that make your symptoms worse, such as:  Caffeine or alcoholic drinks.  Chocolate.  Peppermint or mint flavorings.  Garlic and onions.  Spicy foods.  Citrus fruits, such as oranges, lemons, or limes.  Tomato-based foods such as sauce, chili, salsa, and pizza.  Fried and fatty foods.  Eat small, frequent meals instead of large meals. SEEK IMMEDIATE MEDICAL CARE IF:   You have black or dark red stools.  You vomit blood or material that looks like coffee grounds.  You are unable to keep fluids down.  Your abdominal pain gets worse.  You have a fever.  You do not feel better after 1 week.  You have any other questions or concerns. MAKE SURE YOU:  Understand these instructions.  Will watch your condition.  Will get help right away if you are not doing well or get worse.   This information is not intended to replace advice given to you by your health care provider. Make sure you discuss any questions you have with your health care  provider.   Document Released: 10/14/2001 Document Revised: 04/20/2012 Document Reviewed: 12/03/2011 Elsevier Interactive Patient Education [employer]

## 2016-01-13 ENCOUNTER — Emergency Department (HOSPITAL_COMMUNITY): Payer: Medicaid Other

## 2016-01-13 ENCOUNTER — Encounter (HOSPITAL_COMMUNITY): Payer: Self-pay | Admitting: Emergency Medicine

## 2016-01-13 ENCOUNTER — Observation Stay (HOSPITAL_COMMUNITY)
Admission: EM | Admit: 2016-01-13 | Discharge: 2016-01-14 | Disposition: A | Discharge status: Home / Self Care | Payer: Medicaid Other | Attending: General Surgery | Admitting: General Surgery

## 2016-01-13 DIAGNOSIS — J984 Other disorders of lung: Secondary | ICD-10-CM | POA: Diagnosis not present

## 2016-01-13 DIAGNOSIS — F17200 Nicotine dependence, unspecified, uncomplicated: Secondary | ICD-10-CM | POA: Diagnosis not present

## 2016-01-13 DIAGNOSIS — M25512 Pain in left shoulder: Secondary | ICD-10-CM | POA: Insufficient documentation

## 2016-01-13 DIAGNOSIS — R51 Headache: Secondary | ICD-10-CM | POA: Insufficient documentation

## 2016-01-13 DIAGNOSIS — S27321A Contusion of lung, unilateral, initial encounter: Principal | ICD-10-CM | POA: Insufficient documentation

## 2016-01-13 DIAGNOSIS — S27329A Contusion of lung, unspecified, initial encounter: Secondary | ICD-10-CM | POA: Diagnosis present

## 2016-01-13 DIAGNOSIS — M25511 Pain in right shoulder: Secondary | ICD-10-CM | POA: Diagnosis not present

## 2016-01-13 DIAGNOSIS — S060XAA Concussion with loss of consciousness status unknown, initial encounter: Secondary | ICD-10-CM | POA: Diagnosis present

## 2016-01-13 DIAGNOSIS — M542 Cervicalgia: Secondary | ICD-10-CM | POA: Insufficient documentation

## 2016-01-13 DIAGNOSIS — M25531 Pain in right wrist: Secondary | ICD-10-CM | POA: Insufficient documentation

## 2016-01-13 DIAGNOSIS — S36119A Unspecified injury of liver, initial encounter: Secondary | ICD-10-CM | POA: Insufficient documentation

## 2016-01-13 DIAGNOSIS — S3600XA Unspecified injury of spleen, initial encounter: Secondary | ICD-10-CM | POA: Diagnosis not present

## 2016-01-13 DIAGNOSIS — R0781 Pleurodynia: Secondary | ICD-10-CM | POA: Diagnosis not present

## 2016-01-13 DIAGNOSIS — S060X0A Concussion without loss of consciousness, initial encounter: Secondary | ICD-10-CM | POA: Diagnosis not present

## 2016-01-13 DIAGNOSIS — S060X9A Concussion with loss of consciousness of unspecified duration, initial encounter: Secondary | ICD-10-CM | POA: Diagnosis present

## 2016-01-13 LAB — I-STAT CHEM 8, ED
BUN: 13 mg/dL (ref 6–20)
CHLORIDE: 101 mmol/L (ref 101–111)
Calcium, Ion: 1.14 mmol/L (ref 1.12–1.23)
Creatinine, Ser: 1.1 mg/dL (ref 0.61–1.24)
GLUCOSE: 91 mg/dL (ref 65–99)
HCT: 47 % (ref 39.0–52.0)
Hemoglobin: 16 g/dL (ref 13.0–17.0)
POTASSIUM: 3.6 mmol/L (ref 3.5–5.1)
Sodium: 140 mmol/L (ref 135–145)
TCO2: 24 mmol/L (ref 0–100)

## 2016-01-13 LAB — CBC
HEMATOCRIT: 41.2 % (ref 39.0–52.0)
Hemoglobin: 13.5 g/dL (ref 13.0–17.0)
MCH: 30.5 pg (ref 26.0–34.0)
MCHC: 32.8 g/dL (ref 30.0–36.0)
MCV: 93.2 fL (ref 78.0–100.0)
PLATELETS: 210 10*3/uL (ref 150–400)
RBC: 4.42 MIL/uL (ref 4.22–5.81)
RDW: 13.2 % (ref 11.5–15.5)
WBC: 9.5 10*3/uL (ref 4.0–10.5)

## 2016-01-13 LAB — COMPREHENSIVE METABOLIC PANEL
ALT: 24 U/L (ref 17–63)
AST: 27 U/L (ref 15–41)
Albumin: 4.2 g/dL (ref 3.5–5.0)
Alkaline Phosphatase: 49 U/L (ref 38–126)
Anion gap: 13 (ref 5–15)
BUN: 11 mg/dL (ref 6–20)
CHLORIDE: 104 mmol/L (ref 101–111)
CO2: 24 mmol/L (ref 22–32)
CREATININE: 1.11 mg/dL (ref 0.61–1.24)
Calcium: 9.5 mg/dL (ref 8.9–10.3)
GFR calc Af Amer: >60 mL/min (ref >60)
GLUCOSE: 96 mg/dL (ref 65–99)
POTASSIUM: 3.8 mmol/L (ref 3.5–5.1)
Sodium: 141 mmol/L (ref 135–145)
Total Bilirubin: 0.5 mg/dL (ref 0.3–1.2)
Total Protein: 6.4 g/dL — ABNORMAL LOW (ref 6.5–8.1)

## 2016-01-13 LAB — ETHANOL: Alcohol, Ethyl (B): <5 mg/dL (ref <5)

## 2016-01-13 LAB — I-STAT TROPONIN, ED: TROPONIN I, POC: 0 ng/mL (ref 0.00–0.08)

## 2016-01-13 LAB — PROTIME-INR
INR: 1.02 (ref 0.00–1.49)
Prothrombin Time: 13.6 seconds (ref 11.6–15.2)

## 2016-01-13 LAB — I-STAT CG4 LACTIC ACID, ED: Lactic Acid, Venous: 0.94 mmol/L (ref 0.5–2.0)

## 2016-01-13 LAB — CDS SEROLOGY

## 2016-01-13 MED ORDER — PANTOPRAZOLE SODIUM 40 MG PO TBEC
40.0000 mg | DELAYED_RELEASE_TABLET | Freq: Every day | ORAL | Status: DC
  Administered 2016-01-14: 40 mg via ORAL
  Filled 2016-01-13: qty 1

## 2016-01-13 MED ORDER — PANTOPRAZOLE SODIUM 40 MG IV SOLR: 40.0000 mg | Freq: Every day | INTRAVENOUS | Status: DC

## 2016-01-13 MED ORDER — HYDROMORPHONE HCL 1 MG/ML IJ SOLN
1.0000 mg | Freq: Once | INTRAMUSCULAR | Status: AC
  Administered 2016-01-13: 1 mg via INTRAVENOUS
  Filled 2016-01-13: qty 1

## 2016-01-13 MED ORDER — ONDANSETRON HCL 4 MG/2ML IJ SOLN
4.0000 mg | Freq: Four times a day (QID) | INTRAMUSCULAR | Status: DC | PRN
  Administered 2016-01-14 (×2): 4 mg via INTRAVENOUS
  Filled 2016-01-13 (×2): qty 2

## 2016-01-13 MED ORDER — HYDROMORPHONE HCL 1 MG/ML IJ SOLN: 1.0000 mg | INTRAMUSCULAR | Status: DC | PRN

## 2016-01-13 MED ORDER — ENOXAPARIN SODIUM 40 MG/0.4ML ~~LOC~~ SOLN: 40.0000 mg | SUBCUTANEOUS | Status: DC

## 2016-01-13 MED ORDER — IOHEXOL 300 MG/ML  SOLN
100.0000 mL | Freq: Once | INTRAMUSCULAR | Status: AC | PRN
  Administered 2016-01-13: 100 mL via INTRAVENOUS

## 2016-01-13 MED ORDER — KCL IN DEXTROSE-NACL 20-5-0.9 MEQ/L-%-% IV SOLN
INTRAVENOUS | Status: DC
  Administered 2016-01-13: 23:00:00 via INTRAVENOUS
  Filled 2016-01-13 (×2): qty 1000

## 2016-01-13 MED ORDER — PANTOPRAZOLE SODIUM 20 MG PO TBEC: 20.0000 mg | DELAYED_RELEASE_TABLET | Freq: Every day | ORAL | Status: DC

## 2016-01-13 MED ORDER — ONDANSETRON HCL 4 MG PO TABS: 4.0000 mg | ORAL_TABLET | Freq: Four times a day (QID) | ORAL | Status: DC | PRN

## 2016-01-13 MED ORDER — FENTANYL CITRATE (PF) 100 MCG/2ML IJ SOLN
75.0000 ug | Freq: Once | INTRAMUSCULAR | Status: AC
  Administered 2016-01-13: 75 ug via INTRAVENOUS

## 2016-01-13 MED ORDER — FENTANYL CITRATE (PF) 100 MCG/2ML IJ SOLN
INTRAMUSCULAR | Status: AC
  Filled 2016-01-13: qty 2

## 2016-01-13 MED ORDER — ONDANSETRON HCL 4 MG/2ML IJ SOLN
4.0000 mg | Freq: Once | INTRAMUSCULAR | Status: AC
  Administered 2016-01-13: 4 mg via INTRAVENOUS
  Filled 2016-01-13: qty 2

## 2016-01-13 MED ORDER — HYDROMORPHONE HCL 1 MG/ML IJ SOLN
1.0000 mg | INTRAMUSCULAR | Status: DC | PRN
  Administered 2016-01-13 – 2016-01-14 (×2): 1 mg via INTRAVENOUS
  Filled 2016-01-13 (×2): qty 1

## 2016-01-13 NOTE — ED Notes (Signed)
Patient transported to CT 

## 2016-01-13 NOTE — ED Notes (Signed)
Dr Rhunette CroftNanavati removed C collar
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
## 2016-01-13 NOTE — ED Notes (Signed)
Radiology at bedside
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
## 2016-01-13 NOTE — ED Notes (Signed)
Dr Luisa Hartornett in to see patient
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
## 2016-01-14 ENCOUNTER — Observation Stay (HOSPITAL_COMMUNITY): Payer: Medicaid Other

## 2016-01-14 DIAGNOSIS — S060X9A Concussion with loss of consciousness of unspecified duration, initial encounter: Secondary | ICD-10-CM | POA: Diagnosis present

## 2016-01-14 DIAGNOSIS — S060XAA Concussion with loss of consciousness status unknown, initial encounter: Secondary | ICD-10-CM | POA: Diagnosis present

## 2016-01-14 DIAGNOSIS — M25531 Pain in right wrist: Secondary | ICD-10-CM | POA: Diagnosis present

## 2016-01-14 MED ORDER — ONDANSETRON HCL 4 MG PO TABS: 4.0000 mg | ORAL_TABLET | Freq: Four times a day (QID) | ORAL | Status: DC | PRN

## 2016-01-14 MED ORDER — TRAMADOL HCL 50 MG PO TABS
50.0000 mg | ORAL_TABLET | Freq: Four times a day (QID) | ORAL | Status: DC | PRN
  Administered 2016-01-14 (×2): 100 mg via ORAL
  Filled 2016-01-14 (×2): qty 2

## 2016-01-14 MED ORDER — PROMETHAZINE HCL 25 MG/ML IJ SOLN
12.5000 mg | Freq: Four times a day (QID) | INTRAMUSCULAR | Status: DC | PRN
  Filled 2016-01-14: qty 1

## 2016-01-14 MED ORDER — TRAMADOL HCL 50 MG PO TABS: 50.0000 mg | ORAL_TABLET | Freq: Four times a day (QID) | ORAL | Status: DC | PRN
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
## 2016-01-16 ENCOUNTER — Emergency Department (HOSPITAL_COMMUNITY): Payer: Medicaid Other

## 2016-01-16 ENCOUNTER — Emergency Department (HOSPITAL_COMMUNITY)
Admission: EM | Admit: 2016-01-16 | Discharge: 2016-01-16 | Disposition: A | Discharge status: Home / Self Care | Payer: Medicaid Other | Attending: Emergency Medicine | Admitting: Emergency Medicine

## 2016-01-16 ENCOUNTER — Encounter (HOSPITAL_COMMUNITY): Payer: Self-pay

## 2016-01-16 DIAGNOSIS — S29001A Unspecified injury of muscle and tendon of front wall of thorax, initial encounter: Secondary | ICD-10-CM | POA: Insufficient documentation

## 2016-01-16 DIAGNOSIS — F17200 Nicotine dependence, unspecified, uncomplicated: Secondary | ICD-10-CM | POA: Insufficient documentation

## 2016-01-16 DIAGNOSIS — Y998 Other external cause status: Secondary | ICD-10-CM | POA: Diagnosis not present

## 2016-01-16 DIAGNOSIS — Y92410 Unspecified street and highway as the place of occurrence of the external cause: Secondary | ICD-10-CM | POA: Diagnosis not present

## 2016-01-16 DIAGNOSIS — Z79899 Other long term (current) drug therapy: Secondary | ICD-10-CM | POA: Diagnosis not present

## 2016-01-16 DIAGNOSIS — R0602 Shortness of breath: Secondary | ICD-10-CM | POA: Diagnosis not present

## 2016-01-16 DIAGNOSIS — R079 Chest pain, unspecified: Secondary | ICD-10-CM

## 2016-01-16 DIAGNOSIS — R042 Hemoptysis: Secondary | ICD-10-CM | POA: Insufficient documentation

## 2016-01-16 DIAGNOSIS — J45909 Unspecified asthma, uncomplicated: Secondary | ICD-10-CM | POA: Insufficient documentation

## 2016-01-16 DIAGNOSIS — Y9389 Activity, other specified: Secondary | ICD-10-CM | POA: Diagnosis not present

## 2016-01-16 LAB — CBC WITH DIFFERENTIAL/PLATELET
Basophils Absolute: 0 10*3/uL (ref 0.0–0.1)
Basophils Relative: 0 %
EOS ABS: 0.3 10*3/uL (ref 0.0–0.7)
EOS PCT: 3 %
HCT: 40.4 % (ref 39.0–52.0)
Hemoglobin: 13.1 g/dL (ref 13.0–17.0)
LYMPHS ABS: 3.1 10*3/uL (ref 0.7–4.0)
Lymphocytes Relative: 29 %
MCH: 30.5 pg (ref 26.0–34.0)
MCHC: 32.4 g/dL (ref 30.0–36.0)
MCV: 94 fL (ref 78.0–100.0)
MONOS PCT: 5 %
Monocytes Absolute: 0.5 10*3/uL (ref 0.1–1.0)
Neutro Abs: 6.7 10*3/uL (ref 1.7–7.7)
Neutrophils Relative %: 63 %
PLATELETS: 221 10*3/uL (ref 150–400)
RBC: 4.3 MIL/uL (ref 4.22–5.81)
RDW: 13.5 % (ref 11.5–15.5)
WBC: 10.6 10*3/uL — ABNORMAL HIGH (ref 4.0–10.5)

## 2016-01-16 LAB — COMPREHENSIVE METABOLIC PANEL
ALT: 21 U/L (ref 17–63)
ANION GAP: 10 (ref 5–15)
AST: 21 U/L (ref 15–41)
Albumin: 3.9 g/dL (ref 3.5–5.0)
Alkaline Phosphatase: 52 U/L (ref 38–126)
BUN: 14 mg/dL (ref 6–20)
CHLORIDE: 103 mmol/L (ref 101–111)
CO2: 27 mmol/L (ref 22–32)
Calcium: 9.1 mg/dL (ref 8.9–10.3)
Creatinine, Ser: 1.04 mg/dL (ref 0.61–1.24)
GFR calc non Af Amer: >60 mL/min (ref >60)
Glucose, Bld: 92 mg/dL (ref 65–99)
POTASSIUM: 4 mmol/L (ref 3.5–5.1)
SODIUM: 140 mmol/L (ref 135–145)
TOTAL PROTEIN: 6.1 g/dL — AB (ref 6.5–8.1)
Total Bilirubin: 0.6 mg/dL (ref 0.3–1.2)

## 2016-01-16 MED ORDER — ONDANSETRON HCL 4 MG/2ML IJ SOLN
4.0000 mg | Freq: Once | INTRAMUSCULAR | Status: AC
  Administered 2016-01-16: 4 mg via INTRAVENOUS

## 2016-01-16 MED ORDER — ONDANSETRON HCL 4 MG/2ML IJ SOLN
INTRAMUSCULAR | Status: DC
Start: 2016-01-16 — End: 2016-01-16
  Filled 2016-01-16: qty 2

## 2016-01-16 MED ORDER — IOHEXOL 350 MG/ML SOLN
80.0000 mL | Freq: Once | INTRAVENOUS | Status: AC | PRN
  Administered 2016-01-16: 100 mL via INTRAVENOUS

## 2016-01-16 MED ORDER — MORPHINE SULFATE (PF) 4 MG/ML IV SOLN
4.0000 mg | Freq: Once | INTRAVENOUS | Status: AC
  Administered 2016-01-16: 4 mg via INTRAVENOUS

## 2016-01-16 MED ORDER — SODIUM CHLORIDE 0.9 % IV BOLUS (SEPSIS)
1000.0000 mL | Freq: Once | INTRAVENOUS | Status: AC
  Administered 2016-01-16: 1000 mL via INTRAVENOUS

## 2016-01-16 MED ORDER — MORPHINE SULFATE (PF) 4 MG/ML IV SOLN
INTRAVENOUS | Status: AC
  Filled 2016-01-16: qty 1

## 2016-01-16 MED ORDER — OXYCODONE-ACETAMINOPHEN 5-325 MG PO TABS: 1.0000 | ORAL_TABLET | ORAL | Status: DC | PRN
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
## 2016-01-16 NOTE — ED Notes (Signed)
Pt transported to CT ?
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
## 2016-01-22 ENCOUNTER — Emergency Department (HOSPITAL_COMMUNITY): Payer: Medicaid Other

## 2016-01-22 ENCOUNTER — Observation Stay (HOSPITAL_COMMUNITY)
Admission: EM | Admit: 2016-01-22 | Discharge: 2016-01-23 | Disposition: A | Discharge status: Home / Self Care | Payer: Medicaid Other | Attending: General Surgery | Admitting: General Surgery

## 2016-01-22 ENCOUNTER — Encounter (HOSPITAL_COMMUNITY): Payer: Self-pay | Admitting: Emergency Medicine

## 2016-01-22 DIAGNOSIS — R042 Hemoptysis: Secondary | ICD-10-CM | POA: Diagnosis not present

## 2016-01-22 DIAGNOSIS — D62 Acute posthemorrhagic anemia: Secondary | ICD-10-CM | POA: Diagnosis not present

## 2016-01-22 DIAGNOSIS — Z79899 Other long term (current) drug therapy: Secondary | ICD-10-CM | POA: Diagnosis not present

## 2016-01-22 DIAGNOSIS — F17210 Nicotine dependence, cigarettes, uncomplicated: Secondary | ICD-10-CM | POA: Diagnosis not present

## 2016-01-22 DIAGNOSIS — S27322A Contusion of lung, bilateral, initial encounter: Secondary | ICD-10-CM | POA: Diagnosis not present

## 2016-01-22 DIAGNOSIS — M549 Dorsalgia, unspecified: Secondary | ICD-10-CM | POA: Insufficient documentation

## 2016-01-22 DIAGNOSIS — S27329A Contusion of lung, unspecified, initial encounter: Secondary | ICD-10-CM | POA: Diagnosis not present

## 2016-01-22 DIAGNOSIS — J984 Other disorders of lung: Secondary | ICD-10-CM | POA: Diagnosis not present

## 2016-01-22 DIAGNOSIS — S298XXA Other specified injuries of thorax, initial encounter: Secondary | ICD-10-CM | POA: Diagnosis not present

## 2016-01-22 LAB — CBC WITH DIFFERENTIAL/PLATELET
Basophils Absolute: 0 10*3/uL (ref 0.0–0.1)
Basophils Relative: 0 %
EOS ABS: 0.3 10*3/uL (ref 0.0–0.7)
EOS PCT: 3 %
HCT: 36.2 % — ABNORMAL LOW (ref 39.0–52.0)
Hemoglobin: 11.9 g/dL — ABNORMAL LOW (ref 13.0–17.0)
LYMPHS ABS: 2.7 10*3/uL (ref 0.7–4.0)
LYMPHS PCT: 30 %
MCH: 30.7 pg (ref 26.0–34.0)
MCHC: 32.9 g/dL (ref 30.0–36.0)
MCV: 93.5 fL (ref 78.0–100.0)
MONO ABS: 0.7 10*3/uL (ref 0.1–1.0)
MONOS PCT: 8 %
Neutro Abs: 5.2 10*3/uL (ref 1.7–7.7)
Neutrophils Relative %: 59 %
PLATELETS: 219 10*3/uL (ref 150–400)
RBC: 3.87 MIL/uL — ABNORMAL LOW (ref 4.22–5.81)
RDW: 13.4 % (ref 11.5–15.5)
WBC: 8.9 10*3/uL (ref 4.0–10.5)

## 2016-01-22 LAB — COMPREHENSIVE METABOLIC PANEL
ALK PHOS: 63 U/L (ref 38–126)
ALT: 16 U/L — ABNORMAL LOW (ref 17–63)
ANION GAP: 10 (ref 5–15)
AST: 17 U/L (ref 15–41)
Albumin: 3.7 g/dL (ref 3.5–5.0)
BILIRUBIN TOTAL: 0.5 mg/dL (ref 0.3–1.2)
BUN: 10 mg/dL (ref 6–20)
CALCIUM: 9 mg/dL (ref 8.9–10.3)
CO2: 26 mmol/L (ref 22–32)
Chloride: 104 mmol/L (ref 101–111)
Creatinine, Ser: 1.03 mg/dL (ref 0.61–1.24)
GFR calc Af Amer: >60 mL/min (ref >60)
GLUCOSE: 93 mg/dL (ref 65–99)
POTASSIUM: 3.7 mmol/L (ref 3.5–5.1)
Sodium: 140 mmol/L (ref 135–145)
TOTAL PROTEIN: 6 g/dL — AB (ref 6.5–8.1)

## 2016-01-22 LAB — PROTIME-INR
INR: 0.96 (ref 0.00–1.49)
PROTHROMBIN TIME: 13 s (ref 11.6–15.2)

## 2016-01-22 LAB — I-STAT CHEM 8, ED
BUN: 11 mg/dL (ref 6–20)
CREATININE: 0.9 mg/dL (ref 0.61–1.24)
Calcium, Ion: 1.06 mmol/L — ABNORMAL LOW (ref 1.12–1.23)
Chloride: 101 mmol/L (ref 101–111)
GLUCOSE: 89 mg/dL (ref 65–99)
HEMATOCRIT: 43 % (ref 39.0–52.0)
HEMOGLOBIN: 14.6 g/dL (ref 13.0–17.0)
POTASSIUM: 3.6 mmol/L (ref 3.5–5.1)
Sodium: 139 mmol/L (ref 135–145)
TCO2: 25 mmol/L (ref 0–100)

## 2016-01-22 LAB — APTT: APTT: 30 s (ref 24–37)

## 2016-01-22 MED ORDER — ONDANSETRON HCL 4 MG/2ML IJ SOLN: 4.0000 mg | Freq: Four times a day (QID) | INTRAMUSCULAR | Status: DC | PRN

## 2016-01-22 MED ORDER — KCL IN DEXTROSE-NACL 20-5-0.45 MEQ/L-%-% IV SOLN
INTRAVENOUS | Status: DC
  Administered 2016-01-22: 18:00:00 via INTRAVENOUS
  Filled 2016-01-22 (×2): qty 1000

## 2016-01-22 MED ORDER — MORPHINE SULFATE (PF) 2 MG/ML IV SOLN: 2.0000 mg | INTRAVENOUS | Status: DC | PRN

## 2016-01-22 MED ORDER — TRAMADOL HCL 50 MG PO TABS
50.0000 mg | ORAL_TABLET | Freq: Four times a day (QID) | ORAL | Status: DC | PRN
  Administered 2016-01-22: 100 mg via ORAL
  Administered 2016-01-22: 50 mg via ORAL
  Administered 2016-01-23: 100 mg via ORAL
  Administered 2016-01-23: 50 mg via ORAL
  Filled 2016-01-22 (×2): qty 2
  Filled 2016-01-22 (×2): qty 1
  Filled 2016-01-22: qty 2

## 2016-01-22 MED ORDER — MORPHINE SULFATE (PF) 4 MG/ML IV SOLN
4.0000 mg | Freq: Once | INTRAVENOUS | Status: AC
  Administered 2016-01-22: 4 mg via INTRAVENOUS
  Filled 2016-01-22: qty 1

## 2016-01-22 MED ORDER — ONDANSETRON HCL 4 MG PO TABS: 4.0000 mg | ORAL_TABLET | Freq: Four times a day (QID) | ORAL | Status: DC | PRN

## 2016-01-22 MED ORDER — ACETAMINOPHEN 325 MG PO TABS
650.0000 mg | ORAL_TABLET | Freq: Four times a day (QID) | ORAL | Status: DC | PRN
  Administered 2016-01-22: 650 mg via ORAL
  Filled 2016-01-22: qty 2

## 2016-01-22 MED ORDER — ACETAMINOPHEN 325 MG PO TABS
325.0000 mg | ORAL_TABLET | Freq: Once | ORAL | Status: AC
  Administered 2016-01-22: 325 mg via ORAL
  Filled 2016-01-22: qty 1

## 2016-01-22 MED ORDER — ONDANSETRON HCL 4 MG/2ML IJ SOLN
4.0000 mg | Freq: Once | INTRAMUSCULAR | Status: AC
  Administered 2016-01-22: 4 mg via INTRAVENOUS
  Filled 2016-01-22: qty 2

## 2016-01-22 MED ORDER — DEXTROSE 5 % IV SOLN: 1000.0000 mg | Freq: Three times a day (TID) | INTRAVENOUS | Status: DC | PRN
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
## 2016-01-22 NOTE — ED Notes (Signed)
Patient diet order was placed for lunch.
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
## 2016-01-22 NOTE — ED Notes (Signed)
Report given to nikki

## 2016-01-22 NOTE — ED Notes (Signed)
Dinner tray ordered for pt.  Pt resting quietly at this time with mother at bedside.

## 2016-01-22 NOTE — ED Notes (Signed)
Ordered diet tray 
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
## 2016-01-23 ENCOUNTER — Encounter (HOSPITAL_COMMUNITY): Payer: Self-pay | Admitting: *Deleted

## 2016-01-23 DIAGNOSIS — R042 Hemoptysis: Secondary | ICD-10-CM | POA: Diagnosis present

## 2016-01-23 DIAGNOSIS — J984 Other disorders of lung: Secondary | ICD-10-CM | POA: Diagnosis not present

## 2016-01-23 LAB — CBC
HEMATOCRIT: 41.9 % (ref 39.0–52.0)
Hemoglobin: 13.7 g/dL (ref 13.0–17.0)
MCH: 30.1 pg (ref 26.0–34.0)
MCHC: 32.7 g/dL (ref 30.0–36.0)
MCV: 92.1 fL (ref 78.0–100.0)
Platelets: 297 10*3/uL (ref 150–400)
RBC: 4.55 MIL/uL (ref 4.22–5.81)
RDW: 13.1 % (ref 11.5–15.5)
WBC: 9.5 10*3/uL (ref 4.0–10.5)

## 2016-01-23 MED ORDER — TRAMADOL HCL 50 MG PO TABS: 50.0000 mg | ORAL_TABLET | Freq: Four times a day (QID) | ORAL | Status: AC | PRN

## 2016-01-23 MED ORDER — PANTOPRAZOLE SODIUM 40 MG PO TBEC
40.0000 mg | DELAYED_RELEASE_TABLET | Freq: Every day | ORAL | Status: DC
  Administered 2016-01-23: 40 mg via ORAL
  Filled 2016-01-23: qty 1

## 2016-01-23 NOTE — Progress Notes (Signed)
Discharge home. Home discharge instruction given, no question verbalized. 
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
## 2016-01-23 NOTE — Discharge Instructions (Signed)
Establish care with a primary care provider for further evaluation of your neck/back concerns.  You do not have a diagnosis that warrents referral to a specialist at this time.
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
## 2016-02-25 ENCOUNTER — Ambulatory Visit (INDEPENDENT_AMBULATORY_CARE_PROVIDER_SITE_OTHER): Payer: Medicaid Other | Admitting: Adult Health

## 2016-02-25 ENCOUNTER — Ambulatory Visit (INDEPENDENT_AMBULATORY_CARE_PROVIDER_SITE_OTHER)
Admission: RE | Admit: 2016-02-25 | Discharge: 2016-02-25 | Disposition: A | Discharge status: Home / Self Care | Payer: Medicaid Other | Source: Ambulatory Visit | Attending: Adult Health | Admitting: Adult Health

## 2016-02-25 ENCOUNTER — Encounter: Payer: Self-pay | Admitting: Adult Health

## 2016-02-25 VITALS — BP 112/76 | HR 63 | Temp 97.8°F | Ht 72.0 in | Wt 188.0 lb

## 2016-02-25 DIAGNOSIS — M549 Dorsalgia, unspecified: Secondary | ICD-10-CM

## 2016-02-25 DIAGNOSIS — J984 Other disorders of lung: Secondary | ICD-10-CM

## 2016-02-25 DIAGNOSIS — R042 Hemoptysis: Secondary | ICD-10-CM | POA: Diagnosis not present

## 2016-02-25 DIAGNOSIS — S27329S Contusion of lung, unspecified, sequela: Secondary | ICD-10-CM
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
## 2016-02-25 NOTE — Assessment & Plan Note (Signed)
Resolved.  Set up CT chest in 2 months to follow pneumatocele.  Follow up Dr. Marchelle Gearingamaswamy in 2 months after CT chest .  Warm heat to ribs. As needed   Tylenol or advil As needed  Pain .  Refer to ortho for back pain.   Please contact office for sooner follow up if symptoms do not improve or worsen or seek emergency care

## 2016-02-25 NOTE — Patient Instructions (Signed)
Set up CT chest in 2 months to follow pneumatocele.  Follow up Dr. Marchelle Gearingamaswamy in 2 months after CT chest .  Warm heat to ribs. As needed   Tylenol or advil As needed  Pain .  Refer to ortho for back pain.   Please contact office for sooner follow up if symptoms do not improve or worsen or seek emergency care
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
## 2016-04-29 ENCOUNTER — Inpatient Hospital Stay: Admission: RE | Admit: 2016-04-29 | Payer: Medicaid Other | Source: Ambulatory Visit

## 2016-04-30 ENCOUNTER — Telehealth: Payer: Self-pay | Admitting: Adult Health
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
## 2016-05-09 ENCOUNTER — Ambulatory Visit: Payer: Medicaid Other | Admitting: Internal Medicine

## 2016-05-13 NOTE — Telephone Encounter (Signed)
lmtcb X1 for pt to reschedule CT chest.

## 2016-05-13 NOTE — Telephone Encounter (Signed)
Please call pt and reschedule , if unable will need to send certified letter.

## 2016-05-15 NOTE — Telephone Encounter (Signed)
LMTCB for pt 
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
## 2019-08-02 ENCOUNTER — Encounter (HOSPITAL_COMMUNITY): Payer: Self-pay | Admitting: Emergency Medicine

## 2019-08-02 ENCOUNTER — Emergency Department (HOSPITAL_COMMUNITY)
Admission: EM | Admit: 2019-08-02 | Discharge: 2019-08-02 | Disposition: A | Discharge status: Home / Self Care | Payer: Medicaid Other | Attending: Emergency Medicine | Admitting: Emergency Medicine

## 2019-08-02 DIAGNOSIS — Y929 Unspecified place or not applicable: Secondary | ICD-10-CM | POA: Insufficient documentation

## 2019-08-02 DIAGNOSIS — Y939 Activity, unspecified: Secondary | ICD-10-CM | POA: Insufficient documentation

## 2019-08-02 DIAGNOSIS — J45909 Unspecified asthma, uncomplicated: Secondary | ICD-10-CM | POA: Diagnosis not present

## 2019-08-02 DIAGNOSIS — Y999 Unspecified external cause status: Secondary | ICD-10-CM | POA: Diagnosis not present

## 2019-08-02 DIAGNOSIS — S61214A Laceration without foreign body of right ring finger without damage to nail, initial encounter: Secondary | ICD-10-CM | POA: Diagnosis not present

## 2019-08-02 DIAGNOSIS — S61210A Laceration without foreign body of right index finger without damage to nail, initial encounter: Secondary | ICD-10-CM

## 2019-08-02 DIAGNOSIS — W503XXA Accidental bite by another person, initial encounter: Secondary | ICD-10-CM

## 2019-08-02 DIAGNOSIS — Z23 Encounter for immunization: Secondary | ICD-10-CM | POA: Insufficient documentation

## 2019-08-02 DIAGNOSIS — S20372A Other superficial bite of left front wall of thorax, initial encounter: Secondary | ICD-10-CM | POA: Diagnosis not present

## 2019-08-02 DIAGNOSIS — S6991XA Unspecified injury of right wrist, hand and finger(s), initial encounter: Secondary | ICD-10-CM | POA: Diagnosis present

## 2019-08-02 MED ORDER — NAPROXEN 375 MG PO TABS: 375.0000 mg | ORAL_TABLET | Freq: Two times a day (BID) | ORAL | 0 refills | Status: AC

## 2019-08-02 MED ORDER — AMOXICILLIN-POT CLAVULANATE 875-125 MG PO TABS: 1.0000 | ORAL_TABLET | Freq: Two times a day (BID) | ORAL | 0 refills | Status: DC

## 2019-08-02 MED ORDER — TETANUS-DIPHTH-ACELL PERTUSSIS 5-2.5-18.5 LF-MCG/0.5 IM SUSP
0.5000 mL | Freq: Once | INTRAMUSCULAR | Status: AC
  Administered 2019-08-02: 0.5 mL via INTRAMUSCULAR
  Filled 2019-08-02: qty 0.5

## 2019-08-02 MED ORDER — LIDOCAINE HCL (PF) 1 % IJ SOLN
5.0000 mL | Freq: Once | INTRAMUSCULAR | Status: AC
  Administered 2019-08-02: 5 mL via INTRADERMAL
  Filled 2019-08-02: qty 5

## 2019-08-02 NOTE — Discharge Instructions (Addendum)
Contact a health care provider if you: Have chills. Have pain when you move your injured area. Have trouble moving your injured area. Are not improving, or you are getting worse. Get help right away if you have: A fever. Increasing fluid, blood, or pus coming from your wound. Increasing redness, swelling, or pain at the site of your wound. A red streak extending away from your wound.  WOUND CARE    Keep area clean and dry for 24 hours. Do not remove bandage, if applied.  After 24 hours, remove bandage and wash wound gently with mild soap and warm water. Reapply a new bandage after cleaning wound, if directed.  Continue daily cleansing with soap and water until stitches/staples are removed.  Do not apply any ointments or creams to the wound while stitches/staples are in place, as this may cause delayed healing.  Seek medical careif you experience any of the following signs of infection: Swelling, redness, pus drainage, streaking, fever >101.0 F  Seek care if you experience excessive bleeding that does not stop after 15-20 minutes of constant, firm pressure.
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
## 2019-09-12 ENCOUNTER — Other Ambulatory Visit: Payer: Self-pay

## 2019-09-12 ENCOUNTER — Encounter (HOSPITAL_COMMUNITY): Payer: Self-pay

## 2019-09-12 ENCOUNTER — Ambulatory Visit (HOSPITAL_COMMUNITY)
Admission: EM | Admit: 2019-09-12 | Discharge: 2019-09-12 | Disposition: A | Discharge status: Home / Self Care | Payer: Medicaid Other | Attending: Family Medicine | Admitting: Family Medicine

## 2019-09-12 DIAGNOSIS — F17210 Nicotine dependence, cigarettes, uncomplicated: Secondary | ICD-10-CM | POA: Diagnosis not present

## 2019-09-12 DIAGNOSIS — R431 Parosmia: Secondary | ICD-10-CM

## 2019-09-12 DIAGNOSIS — U071 COVID-19: Secondary | ICD-10-CM | POA: Diagnosis not present

## 2019-09-12 DIAGNOSIS — R439 Unspecified disturbances of smell and taste: Secondary | ICD-10-CM | POA: Diagnosis present

## 2019-09-12 DIAGNOSIS — Z791 Long term (current) use of non-steroidal anti-inflammatories (NSAID): Secondary | ICD-10-CM | POA: Diagnosis not present

## 2019-09-12 DIAGNOSIS — J45909 Unspecified asthma, uncomplicated: Secondary | ICD-10-CM | POA: Diagnosis not present

## 2019-09-12 DIAGNOSIS — R432 Parageusia: Secondary | ICD-10-CM

## 2019-09-12 LAB — COMPREHENSIVE METABOLIC PANEL
ALT: 20 U/L (ref 0–44)
AST: 23 U/L (ref 15–41)
Albumin: 4.3 g/dL (ref 3.5–5.0)
Alkaline Phosphatase: 55 U/L (ref 38–126)
Anion gap: 13 (ref 5–15)
BUN: 11 mg/dL (ref 6–20)
CO2: 24 mmol/L (ref 22–32)
Calcium: 9.7 mg/dL (ref 8.9–10.3)
Chloride: 103 mmol/L (ref 98–111)
Creatinine, Ser: 0.85 mg/dL (ref 0.61–1.24)
GFR calc Af Amer: >60 mL/min (ref >60)
GFR calc non Af Amer: >60 mL/min (ref >60)
Glucose, Bld: 93 mg/dL (ref 70–99)
Potassium: 4 mmol/L (ref 3.5–5.1)
Sodium: 140 mmol/L (ref 135–145)
Total Bilirubin: 0.8 mg/dL (ref 0.3–1.2)
Total Protein: 6.8 g/dL (ref 6.5–8.1)

## 2019-09-12 LAB — CBC WITH DIFFERENTIAL/PLATELET
Abs Immature Granulocytes: 0.02 10*3/uL (ref 0.00–0.07)
Basophils Absolute: 0 10*3/uL (ref 0.0–0.1)
Basophils Relative: 0 %
Eosinophils Absolute: 0 10*3/uL (ref 0.0–0.5)
Eosinophils Relative: 1 %
HCT: 41.2 % (ref 39.0–52.0)
Hemoglobin: 13.5 g/dL (ref 13.0–17.0)
Immature Granulocytes: 0 %
Lymphocytes Relative: 27 %
Lymphs Abs: 1.5 10*3/uL (ref 0.7–4.0)
MCH: 32 pg (ref 26.0–34.0)
MCHC: 32.8 g/dL (ref 30.0–36.0)
MCV: 97.6 fL (ref 80.0–100.0)
Monocytes Absolute: 0.4 10*3/uL (ref 0.1–1.0)
Monocytes Relative: 7 %
Neutro Abs: 3.5 10*3/uL (ref 1.7–7.7)
Neutrophils Relative %: 65 %
Platelets: 224 10*3/uL (ref 150–400)
RBC: 4.22 MIL/uL (ref 4.22–5.81)
RDW: 13.1 % (ref 11.5–15.5)
WBC: 5.4 10*3/uL (ref 4.0–10.5)
nRBC: 0 % (ref 0.0–0.2)

## 2019-09-12 NOTE — Discharge Instructions (Signed)
Will notify you of any positive findings and if any changes to treatment are needed.   Self isolate until covid results are back and negative.  Will notify you by phone of any positive findings. Your negative results will be sent through your MyChart.     If any worsening of symptoms please return or go to the hospital.  I do recommend establishing with a primary care provider for recheck as needed.

## 2019-09-12 NOTE — ED Triage Notes (Signed)
Pt presents to UC w/ c/o smelling ammonia, fever, body aches, loss of taste since 3 days ago.
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
## 2019-09-14 LAB — NOVEL CORONAVIRUS, NAA (HOSP ORDER, SEND-OUT TO REF LAB; TAT 18-24 HRS): SARS-CoV-2, NAA: DETECTED — AB

## 2019-09-15 ENCOUNTER — Telehealth (HOSPITAL_COMMUNITY): Payer: Self-pay | Admitting: Emergency Medicine

## 2019-09-15 NOTE — Telephone Encounter (Signed)
Positive Covid. Pt had reviewed his results in Mychart. All questions answered.

## 2020-05-05 ENCOUNTER — Encounter (HOSPITAL_COMMUNITY): Payer: Self-pay | Admitting: Emergency Medicine

## 2020-05-05 ENCOUNTER — Emergency Department (HOSPITAL_COMMUNITY)
Admission: EM | Admit: 2020-05-05 | Discharge: 2020-05-05 | Disposition: A | Discharge status: Home / Self Care | Payer: Medicaid Other | Attending: Emergency Medicine | Admitting: Emergency Medicine

## 2020-05-05 ENCOUNTER — Other Ambulatory Visit: Payer: Self-pay

## 2020-05-05 DIAGNOSIS — J45909 Unspecified asthma, uncomplicated: Secondary | ICD-10-CM | POA: Insufficient documentation

## 2020-05-05 DIAGNOSIS — S39012A Strain of muscle, fascia and tendon of lower back, initial encounter: Secondary | ICD-10-CM | POA: Diagnosis not present

## 2020-05-05 DIAGNOSIS — Z79899 Other long term (current) drug therapy: Secondary | ICD-10-CM | POA: Diagnosis not present

## 2020-05-05 DIAGNOSIS — Y990 Civilian activity done for income or pay: Secondary | ICD-10-CM | POA: Insufficient documentation

## 2020-05-05 DIAGNOSIS — X500XXA Overexertion from strenuous movement or load, initial encounter: Secondary | ICD-10-CM | POA: Diagnosis not present

## 2020-05-05 DIAGNOSIS — Y9289 Other specified places as the place of occurrence of the external cause: Secondary | ICD-10-CM | POA: Diagnosis not present

## 2020-05-05 DIAGNOSIS — F17200 Nicotine dependence, unspecified, uncomplicated: Secondary | ICD-10-CM | POA: Insufficient documentation

## 2020-05-05 DIAGNOSIS — S3992XA Unspecified injury of lower back, initial encounter: Secondary | ICD-10-CM | POA: Diagnosis present

## 2020-05-05 DIAGNOSIS — Y9389 Activity, other specified: Secondary | ICD-10-CM | POA: Insufficient documentation

## 2020-05-05 MED ORDER — METHOCARBAMOL 500 MG PO TABS
500.0000 mg | ORAL_TABLET | Freq: Two times a day (BID) | ORAL | 0 refills | Status: AC
Start: 2020-05-05 — End: ?

## 2020-05-05 MED ORDER — KETOROLAC TROMETHAMINE 30 MG/ML IJ SOLN
60.0000 mg | Freq: Once | INTRAMUSCULAR | Status: AC
  Administered 2020-05-05: 60 mg via INTRAMUSCULAR
  Filled 2020-05-05: qty 2

## 2020-05-05 MED ORDER — ACETAMINOPHEN 500 MG PO TABS
1000.0000 mg | ORAL_TABLET | Freq: Once | ORAL | Status: AC
  Administered 2020-05-05: 1000 mg via ORAL
  Filled 2020-05-05: qty 2

## 2020-05-05 NOTE — Discharge Instructions (Signed)
Back Pain:  Back pain is very common.  The pain often gets better over time.  The cause of back pain is usually not dangerous.  Most people can learn to manage their back pain on their own.  However if you develop severe or worsening pain, low back pain with fever, numbness, weakness or inability to walk or urinate/stool, you should return to the ER immediately.  Please follow up with your doctor this week for a recheck if still having symptoms.  Low back pain is discomfort in the lower back that may be due to injuries to muscles and ligaments around the spine.  Occasionally, it may be caused by a a problem to a part of the spine called a disc. The pain may last several days or a week;  However, most patients get completely well in 4 weeks.  Medications are also useful to help with pain control.  A commonly prescribed medications includes acetaminophen.  This medication is generally safe, though you should not take more than 8 of the extra strength (500mg) pills a day.  Non steroidal anti inflammatory medications including Ibuprofen and naproxen;  These medications help both pain and swelling and are very useful in treating back pain.  They should be taken with food, as they can cause stomach upset, and more seriously, stomach bleeding.    Be aware that if you develop new symptoms, such as a fever, leg weakness, difficulty with or loss of control of your urine or bowels, abdominal pain, or more severe pain, you will need to seek medical attention and  / or return to the Emergency department.    Home Care Stay active.  Start with short walks on flat ground if you can.  Try to walk farther each day. Do not sit, drive or stand in one place for more than 30 minutes.  Do not stay in bed. Do not avoid exercise or work.  Activity can help your back heal faster. Be careful when you bend or lift an object.  Bend at your knees, keep the object close to you, and do not twist. Sleep on a firm mattress.  Lie  on your side, and bend your knees.  If you lie on your back, put a pillow under your knees. Only take medicines as told by your doctor. Put ice on the injured area. Put ice in a plastic bag Place a towel between your skin and the bag Leave the ice on for 15-20 minutes, 3-4 times a day for the first 2-3 days. 210 After that, you can switch between ice and heat packs. Ask your doctor about back exercises or massage. Avoid feeling anxious or stressed.  Find good ways to deal with stress, such as exercise.  Get Help Right Way If: Your pain does not go away with rest or medicine. Your pain does not go away in 1 week. You have new problems. You do not feel well. The pain spreads into your legs. You cannot control when you poop (bowel movement) or pee (urinate) You feel sick to your stomach (nauseous) or throw up (vomit) You have belly (abdominal) pain. You feel like you may pass out (faint). If you develop a fever.  Make Sure you: Understand these instructions. Watch your condition Get help right away if you are not doing well or get worse.   
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
## 2020-05-05 NOTE — ED Triage Notes (Signed)
Pt. Stated, I woke up last Wednesday with lower back pain and it going up my back.

## 2020-05-05 NOTE — ED Notes (Signed)
Patient given discharge instructions. Questions were answered. Patient verbalized understanding of discharge instructions and care at home.  

## 2023-02-07 ENCOUNTER — Encounter (HOSPITAL_COMMUNITY): Payer: Self-pay

## 2023-02-07 ENCOUNTER — Ambulatory Visit (HOSPITAL_COMMUNITY)
Admission: EM | Admit: 2023-02-07 | Discharge: 2023-02-07 | Disposition: A | Discharge status: Home / Self Care | Payer: Medicaid Other | Attending: Physician Assistant | Admitting: Physician Assistant

## 2023-02-07 DIAGNOSIS — R1013 Epigastric pain: Secondary | ICD-10-CM

## 2023-02-07 DIAGNOSIS — R1310 Dysphagia, unspecified: Secondary | ICD-10-CM | POA: Diagnosis not present

## 2023-02-07 LAB — TSH: TSH: 0.545 u[IU]/mL (ref 0.350–4.500)

## 2023-02-07 MED ORDER — FAMOTIDINE 20 MG PO TABS: 20.0000 mg | ORAL_TABLET | Freq: Two times a day (BID) | ORAL | 0 refills | Status: DC

## 2023-02-07 NOTE — ED Triage Notes (Signed)
Pt is here for a lump in throat, with painful to swallow x 6wks, pt thinks he took a ibuprofen yesterday not sure .

## 2023-02-07 NOTE — Discharge Instructions (Signed)
Advised take the Pepcid 20 mg twice a day to help decrease any gastric irritation.  Lab test will be completed in 48 hours.  If you do not get a call from this office that indicates the test are normal.  Log onto MyChart to be the test results with post in 48 hours.  Advised to call [facility] ear nose and throat to arrange an appointment to be evaluated for painful swallowing.  Advised follow-up PCP return to urgent care as needed.
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
## 2023-02-16 ENCOUNTER — Encounter (HOSPITAL_COMMUNITY): Payer: Self-pay

## 2023-02-16 ENCOUNTER — Ambulatory Visit (HOSPITAL_COMMUNITY)
Admission: EM | Admit: 2023-02-16 | Discharge: 2023-02-16 | Disposition: A | Discharge status: Home / Self Care | Payer: Medicaid Other | Attending: Internal Medicine | Admitting: Internal Medicine

## 2023-02-16 DIAGNOSIS — M25512 Pain in left shoulder: Secondary | ICD-10-CM

## 2023-02-16 MED ORDER — PREDNISONE 10 MG (21) PO TBPK: ORAL_TABLET | Freq: Every day | ORAL | 0 refills | Status: AC

## 2023-02-16 NOTE — Discharge Instructions (Addendum)
Please continue physical therapy exercises recommended previously Avoid taking ibuprofen, naproxen or Advil while taking prednisone Please take Tylenol as needed for pain while you are on the prednisone Icing of the left shoulder Return to urgent care if you have worsening symptoms Please call the sports medicine team and make an appointment.
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
## 2023-02-16 NOTE — ED Triage Notes (Signed)
Pt states that 6 weeks ago he was working and went to lift something and his shoulder popped. Left shoulder. Taking IBU and flexeril. No relief.

## 2023-03-02 ENCOUNTER — Encounter: Payer: Self-pay | Admitting: Orthopedic Surgery

## 2023-03-02 ENCOUNTER — Ambulatory Visit (INDEPENDENT_AMBULATORY_CARE_PROVIDER_SITE_OTHER): Payer: Medicaid Other | Admitting: Orthopedic Surgery

## 2023-03-02 DIAGNOSIS — M25512 Pain in left shoulder: Secondary | ICD-10-CM
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
## 2023-03-17 ENCOUNTER — Ambulatory Visit
Admission: RE | Admit: 2023-03-17 | Discharge: 2023-03-17 | Disposition: A | Discharge status: Home / Self Care | Payer: Medicaid Other | Source: Ambulatory Visit | Attending: Orthopedic Surgery | Admitting: Orthopedic Surgery

## 2023-03-17 DIAGNOSIS — M25512 Pain in left shoulder: Secondary | ICD-10-CM

## 2023-03-17 MED ORDER — IOPAMIDOL (ISOVUE-M 200) INJECTION 41%
12.0000 mL | Freq: Once | INTRAMUSCULAR | Status: AC
  Administered 2023-03-17: 12 mL via INTRA_ARTICULAR

## 2023-04-01 ENCOUNTER — Ambulatory Visit (INDEPENDENT_AMBULATORY_CARE_PROVIDER_SITE_OTHER): Payer: Medicaid Other | Admitting: Orthopedic Surgery

## 2023-04-01 ENCOUNTER — Encounter: Payer: Self-pay | Admitting: Orthopedic Surgery

## 2023-04-01 DIAGNOSIS — M25512 Pain in left shoulder: Secondary | ICD-10-CM
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
## 2023-04-21 ENCOUNTER — Telehealth: Payer: Self-pay | Admitting: Orthopedic Surgery

## 2023-04-21 NOTE — Telephone Encounter (Signed)
yeah

## 2023-04-21 NOTE — Telephone Encounter (Signed)
Patient called advised his shoulder feel about the same.  Patient said he received a phone call and a voicemail 04/02/2023 asking him to make an appointment. The number to contact patient is [phone]

## 2023-04-22 NOTE — Telephone Encounter (Signed)
Do you mind calling and scheduling appt, next available

## 2023-10-31 ENCOUNTER — Emergency Department (HOSPITAL_COMMUNITY): Payer: No Typology Code available for payment source

## 2023-10-31 ENCOUNTER — Emergency Department (HOSPITAL_COMMUNITY)
Admission: EM | Admit: 2023-10-31 | Discharge: 2023-10-31 | Disposition: A | Discharge status: Home / Self Care | Payer: No Typology Code available for payment source | Attending: Emergency Medicine | Admitting: Emergency Medicine

## 2023-10-31 ENCOUNTER — Encounter (HOSPITAL_COMMUNITY): Payer: Self-pay

## 2023-10-31 ENCOUNTER — Other Ambulatory Visit: Payer: Self-pay

## 2023-10-31 DIAGNOSIS — M542 Cervicalgia: Secondary | ICD-10-CM | POA: Diagnosis not present

## 2023-10-31 DIAGNOSIS — R0781 Pleurodynia: Secondary | ICD-10-CM | POA: Diagnosis not present

## 2023-10-31 DIAGNOSIS — R519 Headache, unspecified: Secondary | ICD-10-CM | POA: Insufficient documentation

## 2023-10-31 DIAGNOSIS — M25551 Pain in right hip: Secondary | ICD-10-CM | POA: Insufficient documentation

## 2023-10-31 DIAGNOSIS — J45909 Unspecified asthma, uncomplicated: Secondary | ICD-10-CM | POA: Diagnosis not present

## 2023-10-31 DIAGNOSIS — Y92481 Parking lot as the place of occurrence of the external cause: Secondary | ICD-10-CM | POA: Insufficient documentation

## 2023-10-31 DIAGNOSIS — M545 Low back pain, unspecified: Secondary | ICD-10-CM | POA: Diagnosis present

## 2023-10-31 MED ORDER — ACETAMINOPHEN 500 MG PO TABS
1000.0000 mg | ORAL_TABLET | Freq: Once | ORAL | Status: AC
  Administered 2023-10-31: 1000 mg via ORAL
  Filled 2023-10-31: qty 2
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
## 2023-10-31 NOTE — Discharge Instructions (Signed)
Your imaging today was reassuring.  I recommend you follow close with your primary care doctor.  You can take Tylenol and Motrin for pain.  Return for new or worsening symptoms.

## 2023-10-31 NOTE — ED Notes (Signed)
Pt transported to CT at this time.
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
## 2023-11-25 ENCOUNTER — Ambulatory Visit: Payer: Medicaid Other | Admitting: Physical Medicine and Rehabilitation

## 2023-11-25 ENCOUNTER — Encounter: Payer: Self-pay | Admitting: Physical Medicine and Rehabilitation

## 2023-11-25 DIAGNOSIS — M5416 Radiculopathy, lumbar region: Secondary | ICD-10-CM | POA: Diagnosis not present

## 2023-11-25 DIAGNOSIS — M25551 Pain in right hip: Secondary | ICD-10-CM | POA: Diagnosis not present

## 2023-11-25 DIAGNOSIS — M7918 Myalgia, other site: Secondary | ICD-10-CM
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
## 2023-12-13 ENCOUNTER — Ambulatory Visit
Admission: RE | Admit: 2023-12-13 | Discharge: 2023-12-13 | Disposition: A | Discharge status: Home / Self Care | Payer: Medicaid Other | Source: Ambulatory Visit | Attending: Physical Medicine and Rehabilitation | Admitting: Physical Medicine and Rehabilitation

## 2023-12-13 DIAGNOSIS — M7918 Myalgia, other site: Secondary | ICD-10-CM

## 2023-12-13 DIAGNOSIS — M25551 Pain in right hip: Secondary | ICD-10-CM

## 2023-12-13 DIAGNOSIS — M5416 Radiculopathy, lumbar region: Secondary | ICD-10-CM

## 2023-12-16 ENCOUNTER — Ambulatory Visit (INDEPENDENT_AMBULATORY_CARE_PROVIDER_SITE_OTHER): Payer: Medicaid Other | Admitting: Orthopedic Surgery

## 2023-12-16 DIAGNOSIS — M25551 Pain in right hip: Secondary | ICD-10-CM

## 2023-12-18 ENCOUNTER — Encounter: Payer: Self-pay | Admitting: Orthopedic Surgery
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
## 2024-01-01 ENCOUNTER — Ambulatory Visit
Admission: RE | Admit: 2024-01-01 | Discharge: 2024-01-01 | Disposition: A | Discharge status: Home / Self Care | Payer: Medicaid Other | Source: Ambulatory Visit | Attending: Orthopedic Surgery | Admitting: Orthopedic Surgery

## 2024-01-01 DIAGNOSIS — M25551 Pain in right hip: Secondary | ICD-10-CM

## 2024-01-06 ENCOUNTER — Ambulatory Visit: Payer: Medicaid Other | Admitting: Orthopedic Surgery

## 2024-01-06 DIAGNOSIS — M25551 Pain in right hip: Secondary | ICD-10-CM | POA: Diagnosis not present
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
## 2024-01-10 ENCOUNTER — Encounter: Payer: Self-pay | Admitting: Orthopedic Surgery
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
## 2024-03-09 ENCOUNTER — Encounter (HOSPITAL_COMMUNITY): Payer: Self-pay

## 2024-03-09 ENCOUNTER — Ambulatory Visit

## 2024-03-09 ENCOUNTER — Emergency Department (HOSPITAL_COMMUNITY)
Admission: EM | Admit: 2024-03-09 | Discharge: 2024-03-09 | Disposition: A | Discharge status: Home / Self Care | Attending: Emergency Medicine | Admitting: Emergency Medicine

## 2024-03-09 ENCOUNTER — Other Ambulatory Visit: Payer: Self-pay

## 2024-03-09 ENCOUNTER — Emergency Department (HOSPITAL_COMMUNITY)

## 2024-03-09 DIAGNOSIS — S86111A Strain of other muscle(s) and tendon(s) of posterior muscle group at lower leg level, right leg, initial encounter: Secondary | ICD-10-CM | POA: Diagnosis not present

## 2024-03-09 DIAGNOSIS — X501XXA Overexertion from prolonged static or awkward postures, initial encounter: Secondary | ICD-10-CM | POA: Diagnosis not present

## 2024-03-09 DIAGNOSIS — M7989 Other specified soft tissue disorders: Secondary | ICD-10-CM | POA: Diagnosis not present

## 2024-03-09 DIAGNOSIS — S8991XA Unspecified injury of right lower leg, initial encounter: Secondary | ICD-10-CM | POA: Diagnosis present

## 2024-03-09 MED ORDER — OXYCODONE-ACETAMINOPHEN 5-325 MG PO TABS
1.0000 | ORAL_TABLET | Freq: Once | ORAL | Status: AC
  Administered 2024-03-09: 1 via ORAL
  Filled 2024-03-09: qty 1

## 2024-03-09 MED ORDER — OXYCODONE-ACETAMINOPHEN 5-325 MG PO TABS: 1.0000 | ORAL_TABLET | Freq: Four times a day (QID) | ORAL | 0 refills | Status: AC | PRN

## 2024-03-09 NOTE — ED Triage Notes (Signed)
 Pt states right calf "popped" and is having pressure that happened this morning around 0900. No recent traveling. Denies [facility]

## 2024-03-09 NOTE — Progress Notes (Signed)
 Orthopedic Tech Progress Note Patient Details:  [name] 1986-08-22 [id number]  Ortho Devices Type of Ortho Device: Cotton web roll, Ace wrap, Crutches, Long leg splint Ortho Device/Splint Location: RLE Ortho Device/Splint Interventions: Ordered, Application, Adjustment   Post Interventions Patient Tolerated: Well Instructions Provided: Care of device  PA instructed us  to place the ankle in flexion while we apply the splint.   Monte Bronder L Hatcher Froning 03/09/2024, 8:15 PM

## 2024-03-09 NOTE — Discharge Instructions (Signed)
 Keep the leg elevated (above heart level) as much as possible. Use the crutches to be nonweight bearing.   Follow up with Dr. Hiram Lukes by calling the office in the morning. Per Dr. Hiram Lukes, appointment for one week.   Return to the ED with any new or concerning symptoms. As we discussed, signs of Compartment Syndrome inlcude severe "pain out of proportion" to the injury, tense/tight/harm calf musle, decreased pulses in the foot, tingling or numbness. Return to the ED immediately with any of these signs.

## 2024-03-09 NOTE — ED Provider Notes (Signed)
 [place] EMERGENCY DEPARTMENT AT [facility] Provider Note   CSN: [id number] Arrival date & time: 03/09/24  1402     History  Chief Complaint  Patient presents with   Leg Pain    [name] is a 38 y.o. male.  Patient to ED with sudden onset left lower leg pain while stretching earlier today. He reports the muscle "collapsed" from proximal aspect initially, and became more swollen and painful.   The history is provided by the patient. No language interpreter was used.  Leg Pain      Home Medications Prior to Admission medications   Medication Sig Start Date End Date Taking? Authorizing Provider  oxyCODONE -acetaminophen  (PERCOCET/ROXICET) 5-325 MG tablet Take 1 tablet by mouth every 6 (six) hours as needed for severe pain (pain score 7-10). 03/09/24  Yes Luc Shammas, Clovis Dar, PA-C  methocarbamol  (ROBAXIN ) 500 MG tablet Take 1 tablet (500 mg total) by mouth 2 (two) times daily. 05/05/20   Coretta Dexter, PA  naproxen  (NAPROSYN ) 375 MG tablet Take 1 tablet (375 mg total) by mouth 2 (two) times daily. 08/02/19   Harris, Abigail, PA-C  Oxycodone  HCl 10 MG TABS Take 1 tablet by mouth three times a day 02/06/16   [provider]  predniSONE  (STERAPRED UNI-PAK 21 TAB) 10 MG (21) TBPK tablet Take by mouth daily. Take 6 tabs by mouth daily  for 2 days, then 5 tabs for 2 days, then 4 tabs for 2 days, then 3 tabs for 2 days, 2 tabs for 2 days, then 1 tab by mouth daily for 2 days 02/16/23   Corine Dice, MD  traMADol  (ULTRAM ) 50 MG tablet Take 1-2 tablets (50-100 mg total) by mouth every 6 (six) hours as needed (50mg  for mild pain, 75mg  for moderate pain, 100mg  for severe pain). Patient not taking: Reported on 02/25/2016 01/23/16   Baird, Megan N, PA-C      Allergies    Patient has no known allergies.    Review of Systems   Review of Systems  Physical Exam Updated Vital Signs BP (!) 155/92 (BP Location: Left Arm)   Pulse 78   Temp 98.5 F (36.9 C) (Oral)   Resp 16    Ht 6' (1.829 m)   Wt 99.8 kg   SpO2 100%   BMI 29.84 kg/m  Physical Exam Vitals and nursing note reviewed.  Cardiovascular:     Rate and Rhythm: Normal rate.     Pulses: Normal pulses.  Pulmonary:     Effort: Pulmonary effort is normal.  Musculoskeletal:     Comments: Right calf significant swollen over posterior muscle without visualized deformity. Soft to palpation but significantly tender over midshft. Plantar and dorsiflexion range limited by pain. Achilles intact, nontender.   Skin:    General: Skin is warm and dry.     ED Results / Procedures / Treatments   Labs (all labs ordered are listed, but only abnormal results are displayed) Labs Reviewed - No data to display  EKG None  Radiology MR TIBIA FIBULA RIGHT WO CONTRAST Result Date: 03/09/2024 CLINICAL DATA:  Pain.  Concern for gastrocnemius rupture. EXAM: MRI OF LOWER RIGHT EXTREMITY WITHOUT CONTRAST TECHNIQUE: Multiplanar, multisequence MR imaging of the right tibia and fibula was performed. No intravenous contrast was administered. COMPARISON:  None Available. FINDINGS: Bones/Joint/Cartilage No fracture or dislocation. Normal alignment. No joint effusion. No marrow signal abnormality. Ligaments Collateral ligaments appear intact. Muscles and Tendons Tear of the right medial head of the gastrocnemius muscle  near the myotendinous junction with the Achilles tendon. There is approximately 3 cm of retraction. Perifascial edema with possible hematoma extending between the medial gastrocnemius muscle and overlying posterior compartment fascia to the level of the knee, concerning for myofascial tear. The remainder of the muscles demonstrate normal morphology and signal intensity. Visualized tendons are intact. Soft tissue Soft tissue swelling along the posterior calf. IMPRESSION: Grade 3 tear of the medial head of the right gastrocnemius muscle near the myotendinous junction with the Achilles tendon. There is approximately 3 cm of  retraction of the distal medial gastrocnemius muscle. Perifascial edema with suspected hematoma extending between the medial gastrocnemius muscle and overlying posterior compartment fascia to the level of the knee, concerning for myofascial tear. Electronically Signed   By: Mannie Seek M.D.   On: 03/09/2024 17:57    Procedures Procedures    Medications Ordered in ED Medications  oxyCODONE -acetaminophen  (PERCOCET/ROXICET) 5-325 MG per tablet 1 tablet (1 tablet Oral Given 03/09/24 1603)  oxyCODONE -acetaminophen  (PERCOCET/ROXICET) 5-325 MG per tablet 1 tablet (1 tablet Oral Given 03/09/24 1757)    ED Course/ Medical Decision Making/ A&P                                 Medical Decision Making This patient presents to the ED for concern of lower leg pain, this involves an extensive number of treatment options, and is a complaint that carries with it a high risk of complications and morbidity.  The differential diagnosis includes DVT, fracture, soft tissue injury   Co morbidities that complicate the patient evaluation  Otherwise health 38 yo patient   Additional history obtained:  Additional history and/or information obtained from chart review, notable for No significant medical history   Lab Tests:  I Ordered, and personally interpreted labs.  The pertinent results include:  n/a    Imaging Studies ordered:  I ordered imaging studies including MRI lower leg Per radiologist interpretation: IMPRESSION: Grade 3 tear of the medial head of the right gastrocnemius muscle near the myotendinous junction with the Achilles tendon. There is approximately 3 cm of retraction of the distal medial gastrocnemius muscle. Perifascial edema with suspected hematoma extending between the medial gastrocnemius muscle and overlying posterior compartment fascia to the level of the knee, concerning for myofascial tear.    Cardiac Monitoring:  The patient was maintained on a cardiac monitor.  I  personally viewed and interpreted the cardiac monitored which showed an underlying rhythm of: n/a   Medicines ordered and prescription drug management:  I ordered medication including percocet  for pain Reevaluation of the patient after these medicines showed that the patient improved I have reviewed the patients home medicines and have made adjustments as needed   Test Considered:  N/a   Critical Interventions:  N/a   Consultations Obtained:  I requested consultation with the orthopedics, Dr. Carter Clare,  and discussed lab and imaging findings as well as pertinent plan - they recommend: splint, crutches, office follow up in 1 week. Does not feel it is a surgical issue   Problem List / ED Course:  Sudden onset of pain while stretching this morning MRI showing gastrocnemius myofascial tear Discussed with ortho with plan in place Pain improved.    Reevaluation:  After the interventions noted above, I reevaluated the patient and found that they have :improved   Social Determinants of Health:  Unemployed currently   Disposition:  After consideration of the  diagnostic results and the patients response to treatment, I feel that the patient would benefit from discharge home with outpatient f/u.   Amount and/or Complexity of Data Reviewed Radiology: ordered.  Risk Prescription drug management.           Final Clinical Impression(s) / ED Diagnoses Final diagnoses:  Rupture of right gastrocnemius tendon, initial encounter    Rx / DC Orders ED Discharge Orders          Ordered    oxyCODONE -acetaminophen  (PERCOCET/ROXICET) 5-325 MG tablet  Every 6 hours PRN        03/09/24 2021              Mandy Second, PA-C 03/09/24 2045    Sallyanne Creamer, DO 03/14/24 0710

## 2024-04-07 ENCOUNTER — Ambulatory Visit (INDEPENDENT_AMBULATORY_CARE_PROVIDER_SITE_OTHER): Admitting: Surgical

## 2024-04-07 DIAGNOSIS — M79661 Pain in right lower leg: Secondary | ICD-10-CM | POA: Diagnosis not present

## 2024-04-07 DIAGNOSIS — M25551 Pain in right hip: Secondary | ICD-10-CM

## 2024-04-07 DIAGNOSIS — M5416 Radiculopathy, lumbar region: Secondary | ICD-10-CM

## 2024-04-08 ENCOUNTER — Encounter: Payer: Self-pay | Admitting: Surgical
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
## 2024-04-13 ENCOUNTER — Ambulatory Visit: Attending: Surgical
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
## 2024-04-20 ENCOUNTER — Ambulatory Visit
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
## 2024-05-03 ENCOUNTER — Ambulatory Visit: Attending: Student

## 2024-05-03 ENCOUNTER — Other Ambulatory Visit: Payer: Self-pay

## 2024-05-03 DIAGNOSIS — M25551 Pain in right hip: Secondary | ICD-10-CM | POA: Insufficient documentation

## 2024-05-03 DIAGNOSIS — R262 Difficulty in walking, not elsewhere classified: Secondary | ICD-10-CM | POA: Diagnosis present

## 2024-05-03 DIAGNOSIS — M79661 Pain in right lower leg: Secondary | ICD-10-CM | POA: Diagnosis present

## 2024-05-03 DIAGNOSIS — M5416 Radiculopathy, lumbar region: Secondary | ICD-10-CM | POA: Insufficient documentation

## 2024-05-03 DIAGNOSIS — M6281 Muscle weakness (generalized): Secondary | ICD-10-CM | POA: Insufficient documentation

## 2024-05-03 DIAGNOSIS — M5459 Other low back pain: Secondary | ICD-10-CM | POA: Diagnosis present
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
## 2024-05-09 ENCOUNTER — Ambulatory Visit: Admitting: Physical Therapy

## 2024-05-09 ENCOUNTER — Telehealth: Payer: Self-pay | Admitting: Physical Therapy

## 2024-05-09 NOTE — Telephone Encounter (Signed)
 Spoke with patient regarding missed appointment. He thought his appointment was tomorrow. Confirmed next appointment date / time.
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
## 2024-05-17 ENCOUNTER — Ambulatory Visit

## 2024-05-17 DIAGNOSIS — M25551 Pain in right hip: Secondary | ICD-10-CM

## 2024-05-17 DIAGNOSIS — M6281 Muscle weakness (generalized): Secondary | ICD-10-CM

## 2024-05-17 DIAGNOSIS — M5459 Other low back pain: Secondary | ICD-10-CM

## 2024-05-17 DIAGNOSIS — R262 Difficulty in walking, not elsewhere classified: Secondary | ICD-10-CM

## 2024-05-17 DIAGNOSIS — M79661 Pain in right lower leg: Secondary | ICD-10-CM
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
## 2024-05-19 ENCOUNTER — Ambulatory Visit

## 2024-05-19 DIAGNOSIS — M25551 Pain in right hip: Secondary | ICD-10-CM

## 2024-05-19 DIAGNOSIS — M79661 Pain in right lower leg: Secondary | ICD-10-CM

## 2024-05-19 DIAGNOSIS — M5459 Other low back pain: Secondary | ICD-10-CM

## 2024-05-23 ENCOUNTER — Ambulatory Visit: Admitting: Physical Therapy

## 2024-05-23 DIAGNOSIS — M6281 Muscle weakness (generalized): Secondary | ICD-10-CM

## 2024-05-23 DIAGNOSIS — R262 Difficulty in walking, not elsewhere classified: Secondary | ICD-10-CM

## 2024-05-23 DIAGNOSIS — M79661 Pain in right lower leg: Secondary | ICD-10-CM

## 2024-05-23 DIAGNOSIS — M25551 Pain in right hip: Secondary | ICD-10-CM

## 2024-05-23 DIAGNOSIS — M5459 Other low back pain: Secondary | ICD-10-CM
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
## 2024-05-25 ENCOUNTER — Ambulatory Visit: Admitting: Physical Therapy

## 2024-05-25 ENCOUNTER — Encounter: Payer: Self-pay | Admitting: Physical Therapy

## 2024-05-25 DIAGNOSIS — M79661 Pain in right lower leg: Secondary | ICD-10-CM

## 2024-05-25 DIAGNOSIS — M5459 Other low back pain: Secondary | ICD-10-CM | POA: Diagnosis not present

## 2024-05-25 DIAGNOSIS — M25551 Pain in right hip: Secondary | ICD-10-CM
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
## 2024-05-30 ENCOUNTER — Ambulatory Visit: Admitting: Physical Therapy

## 2024-05-30 ENCOUNTER — Encounter: Payer: Self-pay | Admitting: Physical Therapy

## 2024-05-30 DIAGNOSIS — M5459 Other low back pain: Secondary | ICD-10-CM | POA: Diagnosis not present

## 2024-05-30 DIAGNOSIS — M25551 Pain in right hip: Secondary | ICD-10-CM
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
## 2024-06-01 ENCOUNTER — Ambulatory Visit

## 2024-06-01 DIAGNOSIS — M25551 Pain in right hip: Secondary | ICD-10-CM

## 2024-06-01 DIAGNOSIS — M5459 Other low back pain: Secondary | ICD-10-CM | POA: Diagnosis not present

## 2024-06-01 DIAGNOSIS — R262 Difficulty in walking, not elsewhere classified: Secondary | ICD-10-CM

## 2024-06-01 DIAGNOSIS — M79661 Pain in right lower leg: Secondary | ICD-10-CM

## 2024-06-01 DIAGNOSIS — M6281 Muscle weakness (generalized): Secondary | ICD-10-CM
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
## 2024-06-08 ENCOUNTER — Ambulatory Visit: Attending: Surgical

## 2024-06-08 DIAGNOSIS — M6281 Muscle weakness (generalized): Secondary | ICD-10-CM | POA: Diagnosis present

## 2024-06-08 DIAGNOSIS — M25551 Pain in right hip: Secondary | ICD-10-CM | POA: Insufficient documentation

## 2024-06-08 DIAGNOSIS — M5459 Other low back pain: Secondary | ICD-10-CM | POA: Insufficient documentation

## 2024-06-08 DIAGNOSIS — R262 Difficulty in walking, not elsewhere classified: Secondary | ICD-10-CM | POA: Diagnosis present

## 2024-06-08 DIAGNOSIS — M79661 Pain in right lower leg: Secondary | ICD-10-CM | POA: Diagnosis present
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
## 2024-06-10 ENCOUNTER — Ambulatory Visit

## 2024-06-10 DIAGNOSIS — M79661 Pain in right lower leg: Secondary | ICD-10-CM

## 2024-06-10 DIAGNOSIS — M5459 Other low back pain: Secondary | ICD-10-CM

## 2024-06-10 DIAGNOSIS — M25551 Pain in right hip: Secondary | ICD-10-CM

## 2024-06-10 DIAGNOSIS — R262 Difficulty in walking, not elsewhere classified: Secondary | ICD-10-CM

## 2024-06-10 DIAGNOSIS — M6281 Muscle weakness (generalized): Secondary | ICD-10-CM

## 2024-06-14 ENCOUNTER — Ambulatory Visit
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
## 2024-06-17 ENCOUNTER — Ambulatory Visit

## 2024-06-17 DIAGNOSIS — R262 Difficulty in walking, not elsewhere classified: Secondary | ICD-10-CM

## 2024-06-17 DIAGNOSIS — M5459 Other low back pain: Secondary | ICD-10-CM

## 2024-06-17 DIAGNOSIS — M6281 Muscle weakness (generalized): Secondary | ICD-10-CM

## 2024-06-17 DIAGNOSIS — M79661 Pain in right lower leg: Secondary | ICD-10-CM

## 2024-06-17 DIAGNOSIS — M25551 Pain in right hip: Secondary | ICD-10-CM
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
## 2024-06-21 ENCOUNTER — Encounter

## 2024-06-23 ENCOUNTER — Ambulatory Visit: Admitting: Physical Therapy
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
## 2024-06-28 ENCOUNTER — Ambulatory Visit

## 2024-06-28 DIAGNOSIS — M5459 Other low back pain: Secondary | ICD-10-CM

## 2024-06-28 DIAGNOSIS — M6281 Muscle weakness (generalized): Secondary | ICD-10-CM

## 2024-06-28 DIAGNOSIS — M79661 Pain in right lower leg: Secondary | ICD-10-CM

## 2024-06-28 DIAGNOSIS — R262 Difficulty in walking, not elsewhere classified: Secondary | ICD-10-CM

## 2024-06-28 DIAGNOSIS — M25551 Pain in right hip: Secondary | ICD-10-CM
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
## 2024-07-13 ENCOUNTER — Ambulatory Visit: Attending: Orthopedic Surgery

## 2024-07-13 DIAGNOSIS — M25551 Pain in right hip: Secondary | ICD-10-CM | POA: Insufficient documentation

## 2024-07-13 DIAGNOSIS — M5459 Other low back pain: Secondary | ICD-10-CM | POA: Insufficient documentation

## 2024-07-13 DIAGNOSIS — M6281 Muscle weakness (generalized): Secondary | ICD-10-CM | POA: Insufficient documentation

## 2024-07-13 DIAGNOSIS — M79661 Pain in right lower leg: Secondary | ICD-10-CM | POA: Insufficient documentation

## 2024-07-13 DIAGNOSIS — R262 Difficulty in walking, not elsewhere classified: Secondary | ICD-10-CM | POA: Insufficient documentation

## 2024-07-13 NOTE — Addendum Note (Signed)
 Addended by: JOLINDA DAWN on: 07/13/2024 06:10 PM   Modules accepted: Orders

## 2024-07-28 ENCOUNTER — Ambulatory Visit: Admitting: Physical Therapy

## 2024-07-28 ENCOUNTER — Telehealth: Payer: Self-pay | Admitting: Physical Therapy

## 2024-07-28 NOTE — Telephone Encounter (Signed)
 Contacted patient regarding no show. He did not receive reminder call. Scheduled for 2 more appointments.

## 2024-08-04 ENCOUNTER — Ambulatory Visit: Payer: Self-pay | Attending: Surgical | Admitting: Physical Therapy

## 2024-08-04 DIAGNOSIS — M25551 Pain in right hip: Secondary | ICD-10-CM | POA: Insufficient documentation

## 2024-08-04 DIAGNOSIS — M6281 Muscle weakness (generalized): Secondary | ICD-10-CM | POA: Insufficient documentation

## 2024-08-04 DIAGNOSIS — R262 Difficulty in walking, not elsewhere classified: Secondary | ICD-10-CM | POA: Insufficient documentation

## 2024-08-04 DIAGNOSIS — M79661 Pain in right lower leg: Secondary | ICD-10-CM | POA: Diagnosis present

## 2024-08-04 DIAGNOSIS — M5459 Other low back pain: Secondary | ICD-10-CM | POA: Diagnosis present
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
## 2024-08-09 ENCOUNTER — Ambulatory Visit: Payer: Self-pay | Admitting: Physical Therapy

## 2024-08-09 ENCOUNTER — Encounter: Payer: Self-pay | Admitting: Physical Therapy
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
## 2024-08-19 ENCOUNTER — Ambulatory Visit: Admitting: Physical Therapy

## 2024-08-19 DIAGNOSIS — M5459 Other low back pain: Secondary | ICD-10-CM | POA: Diagnosis not present

## 2024-08-19 DIAGNOSIS — M25551 Pain in right hip: Secondary | ICD-10-CM
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
## 2024-08-22 NOTE — Therapy (Signed)
 Cancelled visit due to no insurance auth

## 2024-08-30 ENCOUNTER — Ambulatory Visit: Admitting: Physical Therapy

## 2024-08-30 DIAGNOSIS — M6281 Muscle weakness (generalized): Secondary | ICD-10-CM

## 2024-08-30 DIAGNOSIS — M5459 Other low back pain: Secondary | ICD-10-CM

## 2024-08-30 DIAGNOSIS — M79661 Pain in right lower leg: Secondary | ICD-10-CM

## 2024-08-30 DIAGNOSIS — M25551 Pain in right hip: Secondary | ICD-10-CM

## 2024-08-30 DIAGNOSIS — R262 Difficulty in walking, not elsewhere classified: Secondary | ICD-10-CM
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
## 2024-09-05 ENCOUNTER — Encounter: Payer: Self-pay | Admitting: Radiology

## 2024-09-09 ENCOUNTER — Ambulatory Visit: Attending: Orthopedic Surgery | Admitting: Physical Therapy

## 2024-09-09 ENCOUNTER — Ambulatory Visit: Admitting: Physical Therapy

## 2024-09-09 ENCOUNTER — Encounter: Payer: Self-pay | Admitting: Physical Therapy

## 2024-09-09 DIAGNOSIS — M5459 Other low back pain: Secondary | ICD-10-CM | POA: Diagnosis present

## 2024-09-09 DIAGNOSIS — R262 Difficulty in walking, not elsewhere classified: Secondary | ICD-10-CM | POA: Diagnosis present

## 2024-09-09 DIAGNOSIS — M25551 Pain in right hip: Secondary | ICD-10-CM | POA: Diagnosis present

## 2024-09-09 DIAGNOSIS — M6281 Muscle weakness (generalized): Secondary | ICD-10-CM | POA: Insufficient documentation

## 2024-09-09 DIAGNOSIS — M79661 Pain in right lower leg: Secondary | ICD-10-CM | POA: Diagnosis present
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
## 2024-09-13 ENCOUNTER — Ambulatory Visit

## 2024-09-13 DIAGNOSIS — M5459 Other low back pain: Secondary | ICD-10-CM | POA: Diagnosis not present

## 2024-09-13 DIAGNOSIS — M25551 Pain in right hip: Secondary | ICD-10-CM

## 2024-09-13 DIAGNOSIS — M6281 Muscle weakness (generalized): Secondary | ICD-10-CM

## 2024-09-13 DIAGNOSIS — M79661 Pain in right lower leg: Secondary | ICD-10-CM
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
## 2024-09-22 ENCOUNTER — Ambulatory Visit

## 2024-09-22 DIAGNOSIS — M5459 Other low back pain: Secondary | ICD-10-CM | POA: Diagnosis not present

## 2024-09-22 DIAGNOSIS — M79661 Pain in right lower leg: Secondary | ICD-10-CM

## 2024-09-22 DIAGNOSIS — M6281 Muscle weakness (generalized): Secondary | ICD-10-CM

## 2024-09-22 DIAGNOSIS — M25551 Pain in right hip: Secondary | ICD-10-CM

## 2024-09-22 DIAGNOSIS — R262 Difficulty in walking, not elsewhere classified: Secondary | ICD-10-CM

## 2024-10-06 ENCOUNTER — Ambulatory Visit

## 2024-10-06 DIAGNOSIS — M6281 Muscle weakness (generalized): Secondary | ICD-10-CM | POA: Insufficient documentation

## 2024-10-06 DIAGNOSIS — M5459 Other low back pain: Secondary | ICD-10-CM | POA: Insufficient documentation

## 2024-10-06 DIAGNOSIS — R262 Difficulty in walking, not elsewhere classified: Secondary | ICD-10-CM | POA: Diagnosis present

## 2024-10-06 DIAGNOSIS — M25551 Pain in right hip: Secondary | ICD-10-CM | POA: Diagnosis present

## 2024-10-06 DIAGNOSIS — M79661 Pain in right lower leg: Secondary | ICD-10-CM | POA: Diagnosis present
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
## 2024-10-11 ENCOUNTER — Ambulatory Visit

## 2024-10-12 ENCOUNTER — Ambulatory Visit

## 2024-10-12 DIAGNOSIS — M5459 Other low back pain: Secondary | ICD-10-CM | POA: Diagnosis not present

## 2024-10-12 DIAGNOSIS — M79661 Pain in right lower leg: Secondary | ICD-10-CM

## 2024-10-12 DIAGNOSIS — M25551 Pain in right hip: Secondary | ICD-10-CM

## 2024-10-12 DIAGNOSIS — R262 Difficulty in walking, not elsewhere classified: Secondary | ICD-10-CM

## 2024-10-12 DIAGNOSIS — M6281 Muscle weakness (generalized): Secondary | ICD-10-CM
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
## 2024-10-19 ENCOUNTER — Ambulatory Visit

## 2024-10-19 DIAGNOSIS — M5459 Other low back pain: Secondary | ICD-10-CM | POA: Diagnosis not present

## 2024-10-19 DIAGNOSIS — M6281 Muscle weakness (generalized): Secondary | ICD-10-CM

## 2024-10-19 DIAGNOSIS — M25551 Pain in right hip: Secondary | ICD-10-CM
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
## 2024-10-21 ENCOUNTER — Ambulatory Visit

## 2024-10-21 DIAGNOSIS — M25551 Pain in right hip: Secondary | ICD-10-CM

## 2024-10-21 DIAGNOSIS — M6281 Muscle weakness (generalized): Secondary | ICD-10-CM

## 2024-10-21 DIAGNOSIS — M5459 Other low back pain: Secondary | ICD-10-CM

## 2024-10-21 DIAGNOSIS — M79661 Pain in right lower leg: Secondary | ICD-10-CM
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
## 2024-10-25 ENCOUNTER — Ambulatory Visit

## 2024-10-25 DIAGNOSIS — M5459 Other low back pain: Secondary | ICD-10-CM | POA: Diagnosis not present

## 2024-10-25 DIAGNOSIS — M79661 Pain in right lower leg: Secondary | ICD-10-CM

## 2024-10-25 DIAGNOSIS — M6281 Muscle weakness (generalized): Secondary | ICD-10-CM

## 2024-10-25 DIAGNOSIS — M25551 Pain in right hip: Secondary | ICD-10-CM

## 2024-10-25 DIAGNOSIS — R262 Difficulty in walking, not elsewhere classified: Secondary | ICD-10-CM

## 2024-10-28 ENCOUNTER — Ambulatory Visit

## 2024-10-31 ENCOUNTER — Encounter: Payer: Self-pay | Admitting: Physical Therapy

## 2024-10-31 ENCOUNTER — Ambulatory Visit: Admitting: Physical Therapy

## 2024-10-31 DIAGNOSIS — M5459 Other low back pain: Secondary | ICD-10-CM
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
